# Patient Record
Sex: Female | Born: 1950
Health system: Southern US, Community
[De-identification: ages and names within clinical notes are randomized; demographics above are authoritative.]

## PROBLEM LIST (undated history)

## (undated) DIAGNOSIS — E119 Type 2 diabetes mellitus without complications: Secondary | ICD-10-CM

## (undated) DIAGNOSIS — R030 Elevated blood-pressure reading, without diagnosis of hypertension: Secondary | ICD-10-CM

## (undated) DIAGNOSIS — E669 Obesity, unspecified: Secondary | ICD-10-CM

## (undated) DIAGNOSIS — R748 Abnormal levels of other serum enzymes: Secondary | ICD-10-CM

## (undated) HISTORY — PX: ABDOMINAL HYSTERECTOMY: SHX81

## (undated) HISTORY — PX: TUBAL LIGATION: SHX77

---

## 2004-04-08 ENCOUNTER — Ambulatory Visit: Payer: Self-pay | Admitting: General Surgery

## 2004-06-11 ENCOUNTER — Ambulatory Visit: Payer: Self-pay | Admitting: Family Medicine

## 2005-07-28 ENCOUNTER — Ambulatory Visit: Payer: Self-pay | Admitting: Family Medicine

## 2007-02-03 ENCOUNTER — Ambulatory Visit: Payer: Self-pay | Admitting: Family Medicine

## 2007-05-10 DIAGNOSIS — E119 Type 2 diabetes mellitus without complications: Secondary | ICD-10-CM | POA: Insufficient documentation

## 2007-05-13 DIAGNOSIS — R899 Unspecified abnormal finding in specimens from other organs, systems and tissues: Secondary | ICD-10-CM | POA: Insufficient documentation

## 2008-07-03 DIAGNOSIS — R519 Headache, unspecified: Secondary | ICD-10-CM | POA: Insufficient documentation

## 2009-01-16 ENCOUNTER — Ambulatory Visit: Payer: Self-pay | Admitting: Family Medicine

## 2010-02-19 ENCOUNTER — Ambulatory Visit: Payer: Self-pay | Admitting: Family Medicine

## 2011-04-15 ENCOUNTER — Ambulatory Visit: Payer: Self-pay | Admitting: Family Medicine

## 2012-11-24 ENCOUNTER — Ambulatory Visit: Payer: Self-pay | Admitting: Family Medicine

## 2014-01-20 LAB — BASIC METABOLIC PANEL
BUN: 9 mg/dL (ref 4–21)
Creatinine: 0.7 mg/dL (ref 0.5–1.1)
GLUCOSE: 154 mg/dL
Potassium: 4.1 mmol/L (ref 3.4–5.3)
Sodium: 140 mmol/L (ref 137–147)

## 2014-01-20 LAB — CBC AND DIFFERENTIAL
HCT: 44 % (ref 36–46)
Hemoglobin: 15.1 g/dL (ref 12.0–16.0)
Platelets: 237 10*3/uL (ref 150–399)
WBC: 7.1 10^3/mL

## 2014-01-20 LAB — HEPATIC FUNCTION PANEL
ALT: 46 U/L — AB (ref 7–35)
AST: 63 U/L — AB (ref 13–35)

## 2014-01-20 LAB — LIPID PANEL
Cholesterol: 222 mg/dL — AB (ref 0–200)
HDL: 58 mg/dL (ref 35–70)
LDL Cholesterol: 127 mg/dL
Triglycerides: 185 mg/dL — AB (ref 40–160)

## 2014-01-20 LAB — TSH: TSH: 1.21 u[IU]/mL (ref 0.41–5.90)

## 2014-01-20 LAB — HEMOGLOBIN A1C: HEMOGLOBIN A1C: 7.5 % — AB (ref 4.0–6.0)

## 2014-03-08 ENCOUNTER — Ambulatory Visit: Payer: Self-pay | Admitting: Family Medicine

## 2014-03-08 LAB — HM MAMMOGRAPHY

## 2014-03-27 ENCOUNTER — Ambulatory Visit: Payer: Self-pay | Admitting: Gastroenterology

## 2014-03-27 LAB — HM COLONOSCOPY: HM Colonoscopy: 4

## 2014-07-14 ENCOUNTER — Other Ambulatory Visit: Payer: Self-pay | Admitting: Internal Medicine

## 2014-07-14 DIAGNOSIS — Z1231 Encounter for screening mammogram for malignant neoplasm of breast: Secondary | ICD-10-CM

## 2014-08-30 ENCOUNTER — Encounter (INDEPENDENT_AMBULATORY_CARE_PROVIDER_SITE_OTHER): Payer: Self-pay | Admitting: Ophthalmology

## 2014-09-29 HISTORY — PX: EYE SURGERY: SHX253

## 2015-02-13 DIAGNOSIS — E7800 Pure hypercholesterolemia, unspecified: Secondary | ICD-10-CM | POA: Insufficient documentation

## 2015-02-13 DIAGNOSIS — E559 Vitamin D deficiency, unspecified: Secondary | ICD-10-CM | POA: Insufficient documentation

## 2015-02-13 DIAGNOSIS — R748 Abnormal levels of other serum enzymes: Secondary | ICD-10-CM | POA: Insufficient documentation

## 2015-02-13 DIAGNOSIS — E669 Obesity, unspecified: Secondary | ICD-10-CM | POA: Insufficient documentation

## 2015-02-13 DIAGNOSIS — E663 Overweight: Secondary | ICD-10-CM | POA: Insufficient documentation

## 2015-02-13 DIAGNOSIS — R030 Elevated blood-pressure reading, without diagnosis of hypertension: Secondary | ICD-10-CM | POA: Insufficient documentation

## 2015-02-16 ENCOUNTER — Encounter: Payer: Self-pay | Admitting: Family Medicine

## 2015-02-16 ENCOUNTER — Ambulatory Visit (INDEPENDENT_AMBULATORY_CARE_PROVIDER_SITE_OTHER): Payer: BLUE CROSS/BLUE SHIELD | Admitting: Family Medicine

## 2015-02-16 VITALS — BP 102/64 | HR 64 | Temp 98.7°F | Resp 16 | Ht 61.5 in | Wt 127.0 lb

## 2015-02-16 DIAGNOSIS — E7800 Pure hypercholesterolemia, unspecified: Secondary | ICD-10-CM

## 2015-02-16 DIAGNOSIS — Z0000 Encounter for general adult medical examination without abnormal findings: Secondary | ICD-10-CM | POA: Diagnosis not present

## 2015-02-16 DIAGNOSIS — R748 Abnormal levels of other serum enzymes: Secondary | ICD-10-CM | POA: Diagnosis not present

## 2015-02-16 DIAGNOSIS — E119 Type 2 diabetes mellitus without complications: Secondary | ICD-10-CM | POA: Diagnosis not present

## 2015-02-16 DIAGNOSIS — E559 Vitamin D deficiency, unspecified: Secondary | ICD-10-CM | POA: Diagnosis not present

## 2015-02-16 LAB — POCT URINALYSIS DIPSTICK
Bilirubin, UA: NEGATIVE
Glucose, UA: NEGATIVE
Ketones, UA: NEGATIVE
Leukocytes, UA: NEGATIVE
NITRITE UA: NEGATIVE
PH UA: 8
Protein, UA: NEGATIVE
RBC UA: NEGATIVE
Spec Grav, UA: 1.01
UROBILINOGEN UA: 0.2

## 2015-02-16 NOTE — Progress Notes (Signed)
Patient ID: Taylor Goodwin, female   DOB: 05-20-1950, 64 y.o.   MRN: [id number]        Patient: Taylor Goodwin, Female    DOB: 1950-12-31, 64 y.o.   MRN: [id number] Visit Date: 02/16/2015  Today's Provider: Margarita Rana, MD   Chief Complaint  Patient presents with  . Annual Exam   Subjective:    Annual physical exam Taylor Goodwin is a 64 y.o. female who presents today for health maintenance and complete physical. She feels well. She reports exercising 2 times a week. She reports she is sleeping well.  Has lost a lot of weight with lifestyle changes. Hoping labs will look better.   01/20/14 CPE 03/08/14 Mammo-BI-RADS 1 03/27/14 Colon-polyps, recheck 5 yrs  Results for orders placed or performed in visit on 02/16/15  POCT urinalysis dipstick  Result Value Ref Range   Color, UA yellow    Clarity, UA clear    Glucose, UA neg    Bilirubin, UA neg    Ketones, UA neg    Spec Grav, UA 1.010    Blood, UA neg    pH, UA 8.0    Protein, UA neg    Urobilinogen, UA 0.2    Nitrite, UA neg    Leukocytes, UA Negative Negative    Lab Results  Component Value Date   WBC 7.1 01/20/2014   HGB 15.1 01/20/2014   HCT 44 01/20/2014   PLT 237 01/20/2014   CHOL 222* 01/20/2014   TRIG 185* 01/20/2014   HDL 58 01/20/2014   LDLCALC 127 01/20/2014   ALT 46* 01/20/2014   AST 63* 01/20/2014   NA 140 01/20/2014   K 4.1 01/20/2014   CREATININE 0.7 01/20/2014   BUN 9 01/20/2014   TSH 1.21 01/20/2014   HGBA1C 7.5* 01/20/2014     -----------------------------------------------------------------   Review of Systems  Constitutional: Negative.   HENT: Negative.   Eyes: Negative.   Respiratory: Negative.   Cardiovascular: Negative.   Gastrointestinal: Negative.   Endocrine: Negative.   Genitourinary: Negative.   Musculoskeletal: Negative.   Skin: Negative.   Allergic/Immunologic: Negative.   Neurological: Negative.   Hematological: Negative.   Psychiatric/Behavioral: Negative.      Social History She  reports that she has never smoked. She has never used smokeless tobacco. She reports that she does not drink alcohol or use illicit drugs. Social History   Social History  . Marital Status: Married    Spouse Name: N/A  . Number of Children: N/A  . Years of Education: N/A   Social History Main Topics  . Smoking status: Never Smoker   . Smokeless tobacco: Never Used  . Alcohol Use: No  . Drug Use: No  . Sexual Activity: Not Asked   Other Topics Concern  . None   Social History Narrative    Patient Active Problem List   Diagnosis Date Noted  . Adult BMI 30+ 02/13/2015  . Abnormal liver enzymes 02/13/2015  . Blood pressure elevated without history of HTN 02/13/2015  . Hypercholesteremia 02/13/2015  . Adiposity 02/13/2015  . Avitaminosis D 02/13/2015  . Cephalalgia 07/03/2008  . Abnormal laboratory test 05/13/2007  . Diabetes mellitus, type 2 ([place]) 05/10/2007    Past Surgical History  Procedure Laterality Date  . Abdominal hysterectomy    . Tubal ligation    . Cesarean section      x 2  . Eye surgery Bilateral 09/2014    cataract [facility]    Family History  Family Status  Relation Status Death Age  . Mother Deceased   . Father Deceased   . Brother Alive   . Brother Alive    Her family history includes Bipolar disorder in her father; Breast cancer in her mother; Diabetes in her other; Hypertension in her brother, brother, and father; Schizophrenia in her father.    No Known Allergies  Previous Medications   CALCIUM CARBONATE-VITAMIN D 600-200 MG-UNIT CAPS       LORATADINE (CLARITIN) 10 MG TABLET    Take by mouth.   MULTIPLE VITAMIN PO        Patient Care Team: Margarita Rana, MD as PCP - General (Family Medicine)     Objective:   Vitals: BP 102/64 mmHg  Pulse 64  Temp(Src) 98.7 F (37.1 C) (Oral)  Resp 16  Ht 5' 1.5" (1.562 m)  Wt 127 lb (57.607 kg)  BMI 23.61 kg/m2  SpO2 100%   Physical Exam  Constitutional: She is  oriented to person, place, and time. She appears well-developed and well-nourished.  HENT:  Head: Normocephalic and atraumatic.  Right Ear: Tympanic membrane, external ear and ear canal normal.  Left Ear: Tympanic membrane, external ear and ear canal normal.  Nose: Nose normal.  Mouth/Throat: Uvula is midline, oropharynx is clear and moist and mucous membranes are normal.  Eyes: Conjunctivae, EOM and lids are normal. Pupils are equal, round, and reactive to light.  Neck: Trachea normal and normal range of motion. Neck supple. Carotid bruit is not present. No thyroid mass and no thyromegaly present.  Cardiovascular: Normal rate, regular rhythm and normal heart sounds.   Pulmonary/Chest: Effort normal and breath sounds normal.  Abdominal: Soft. Normal appearance and bowel sounds are normal. There is no hepatosplenomegaly. There is no tenderness.  Genitourinary: No breast swelling, tenderness or discharge.  Musculoskeletal: Normal range of motion.  Lymphadenopathy:    She has no cervical adenopathy.    She has no axillary adenopathy.  Neurological: She is alert and oriented to person, place, and time. She has normal strength. No cranial nerve deficit.  Skin: Skin is warm, dry and intact.  Psychiatric: She has a normal mood and affect. Her speech is normal and behavior is normal. Judgment and thought content normal. Cognition and memory are normal.     Depression Screen PHQ 2/9 Scores 02/16/2015  PHQ - 2 Score 0      Assessment & Plan:     Routine Health Maintenance and Physical Exam  Exercise Activities and Dietary recommendations Goals    . Exercise 150 minutes per week (moderate activity)           1. Annual physical exam Stable. Patient advised to continue eating healthy and exercise daily. - POCT urinalysis dipstick  2. Diabetes mellitus without complication ([place]) - Hemoglobin A1c  3. Abnormal liver enzymes - CBC with Differential/Platelet - Comprehensive  metabolic panel  4. Hypercholesteremia - Lipid Panel With LDL/HDL Ratio - TSH  5. Avitaminosis D - VITAMIN D 25 Hydroxy (Vit-D Deficiency, Fractures)      Patient seen and examined by Dr. Jerrell Belfast, and note scribed by Philbert Riser. Dimas, CMA.  I have reviewed the document for accuracy and completeness and I agree with above. Jerrell Belfast, MD   Margarita Rana, MD    --------------------------------------------------------------------

## 2015-02-17 LAB — CBC WITH DIFFERENTIAL/PLATELET
BASOS ABS: 0 10*3/uL (ref 0.0–0.2)
Basos: 0 %
EOS (ABSOLUTE): 0.1 10*3/uL (ref 0.0–0.4)
Eos: 1 %
HEMATOCRIT: 42.7 % (ref 34.0–46.6)
Hemoglobin: 14.7 g/dL (ref 11.1–15.9)
Immature Grans (Abs): 0 10*3/uL (ref 0.0–0.1)
Immature Granulocytes: 0 %
LYMPHS ABS: 2.5 10*3/uL (ref 0.7–3.1)
Lymphs: 36 %
MCH: 31.3 pg (ref 26.6–33.0)
MCHC: 34.4 g/dL (ref 31.5–35.7)
MCV: 91 fL (ref 79–97)
MONOS ABS: 0.3 10*3/uL (ref 0.1–0.9)
Monocytes: 5 %
Neutrophils Absolute: 3.9 10*3/uL (ref 1.4–7.0)
Neutrophils: 58 %
Platelets: 239 10*3/uL (ref 150–379)
RBC: 4.69 x10E6/uL (ref 3.77–5.28)
RDW: 12.9 % (ref 12.3–15.4)
WBC: 6.9 10*3/uL (ref 3.4–10.8)

## 2015-02-17 LAB — COMPREHENSIVE METABOLIC PANEL
ALK PHOS: 71 IU/L (ref 39–117)
ALT: 14 IU/L (ref 0–32)
AST: 28 IU/L (ref 0–40)
Albumin/Globulin Ratio: 1.9 (ref 1.1–2.5)
Albumin: 5 g/dL — ABNORMAL HIGH (ref 3.6–4.8)
BUN/Creatinine Ratio: 12 (ref 11–26)
BUN: 9 mg/dL (ref 8–27)
Bilirubin Total: 0.5 mg/dL (ref 0.0–1.2)
CO2: 25 mmol/L (ref 18–29)
CREATININE: 0.73 mg/dL (ref 0.57–1.00)
Calcium: 10.4 mg/dL — ABNORMAL HIGH (ref 8.7–10.3)
Chloride: 102 mmol/L (ref 97–106)
GFR calc Af Amer: 102 mL/min/{1.73_m2} (ref >59)
GFR calc non Af Amer: 89 mL/min/{1.73_m2} (ref >59)
GLOBULIN, TOTAL: 2.6 g/dL (ref 1.5–4.5)
Glucose: 93 mg/dL (ref 65–99)
POTASSIUM: 4.8 mmol/L (ref 3.5–5.2)
Sodium: 143 mmol/L (ref 136–144)
Total Protein: 7.6 g/dL (ref 6.0–8.5)

## 2015-02-17 LAB — HEMOGLOBIN A1C
ESTIMATED AVERAGE GLUCOSE: 105 mg/dL
HEMOGLOBIN A1C: 5.3 % (ref 4.8–5.6)

## 2015-02-17 LAB — LIPID PANEL WITH LDL/HDL RATIO
Cholesterol, Total: 207 mg/dL — ABNORMAL HIGH (ref 100–199)
HDL: 68 mg/dL (ref >39)
LDL Calculated: 118 mg/dL — ABNORMAL HIGH (ref 0–99)
LDl/HDL Ratio: 1.7 ratio units (ref 0.0–3.2)
Triglycerides: 105 mg/dL (ref 0–149)
VLDL Cholesterol Cal: 21 mg/dL (ref 5–40)

## 2015-02-17 LAB — VITAMIN D 25 HYDROXY (VIT D DEFICIENCY, FRACTURES): VIT D 25 HYDROXY: 35.9 ng/mL (ref 30.0–100.0)

## 2015-02-17 LAB — TSH: TSH: 1.32 u[IU]/mL (ref 0.450–4.500)

## 2015-02-19 ENCOUNTER — Telehealth: Payer: Self-pay

## 2015-02-19 NOTE — Telephone Encounter (Signed)
Patient advised as directed below. Patient will call a day before for the labs.  Thanks,  -Akira Perusse

## 2015-02-19 NOTE — Telephone Encounter (Signed)
-----   Message from Margarita Rana, MD sent at 02/17/2015  8:58 AM EST ----- Labs look great. Blood sugar and liver enzymes now normal.  Triglycerides also now normal. Calcium and albumin mildly elevated.  May be because dehydrated when had labs done. Just recheck in 2 weeks.  Thanks.

## 2015-03-12 ENCOUNTER — Ambulatory Visit
Admission: RE | Admit: 2015-03-12 | Discharge: 2015-03-12 | Disposition: A | Discharge status: Home / Self Care | Payer: BLUE CROSS/BLUE SHIELD | Source: Ambulatory Visit | Attending: Internal Medicine | Admitting: Internal Medicine

## 2015-03-12 DIAGNOSIS — Z1231 Encounter for screening mammogram for malignant neoplasm of breast: Secondary | ICD-10-CM | POA: Diagnosis not present

## 2015-04-16 ENCOUNTER — Encounter: Payer: Self-pay | Admitting: Family Medicine

## 2016-02-18 ENCOUNTER — Encounter: Payer: Self-pay | Admitting: Physician Assistant

## 2016-02-18 ENCOUNTER — Ambulatory Visit (INDEPENDENT_AMBULATORY_CARE_PROVIDER_SITE_OTHER): Payer: BLUE CROSS/BLUE SHIELD | Admitting: Physician Assistant

## 2016-02-18 VITALS — BP 142/80 | HR 61 | Temp 98.2°F | Resp 16 | Ht 61.0 in | Wt 142.0 lb

## 2016-02-18 DIAGNOSIS — Z1159 Encounter for screening for other viral diseases: Secondary | ICD-10-CM | POA: Diagnosis not present

## 2016-02-18 DIAGNOSIS — Z0000 Encounter for general adult medical examination without abnormal findings: Secondary | ICD-10-CM

## 2016-02-18 DIAGNOSIS — E118 Type 2 diabetes mellitus with unspecified complications: Secondary | ICD-10-CM | POA: Diagnosis not present

## 2016-02-18 DIAGNOSIS — R030 Elevated blood-pressure reading, without diagnosis of hypertension: Secondary | ICD-10-CM | POA: Diagnosis not present

## 2016-02-18 DIAGNOSIS — Z1231 Encounter for screening mammogram for malignant neoplasm of breast: Secondary | ICD-10-CM

## 2016-02-18 DIAGNOSIS — E559 Vitamin D deficiency, unspecified: Secondary | ICD-10-CM

## 2016-02-18 DIAGNOSIS — E7800 Pure hypercholesterolemia, unspecified: Secondary | ICD-10-CM | POA: Diagnosis not present

## 2016-02-18 DIAGNOSIS — R748 Abnormal levels of other serum enzymes: Secondary | ICD-10-CM

## 2016-02-18 DIAGNOSIS — Z1239 Encounter for other screening for malignant neoplasm of breast: Secondary | ICD-10-CM

## 2016-02-18 NOTE — Progress Notes (Signed)
Patient: Taylor Goodwin, Female    DOB: 07/10/1950, 65 y.o.   MRN: [id number] Visit Date: 02/18/2016  Today's Provider: Mar Daring, PA-C   Chief Complaint  Patient presents with  . Annual Exam   Subjective:    Annual physical exam Taylor Goodwin is a 65 y.o. female who presents today for health maintenance and complete physical. She feels well. She reports exercising. She reports she is sleeping well. She reports that she got her influenza vaccine at work.  Mammogram:03/12/15-BI-RADS 1 Colonoscopy:03/27/14-Polyps Recheck in 5 years. -----------------------------------------------------------------   Review of Systems  Constitutional: Negative.   HENT: Negative.   Eyes: Negative.   Respiratory: Negative.   Cardiovascular: Negative.   Gastrointestinal: Negative.   Endocrine: Negative.   Genitourinary: Negative.   Musculoskeletal: Negative.   Skin: Negative.   Allergic/Immunologic: Negative.   Neurological: Negative.   Hematological: Negative.   Psychiatric/Behavioral: Negative.     Social History      She  reports that she has never smoked. She has never used smokeless tobacco. She reports that she does not drink alcohol or use drugs.       Social History   Social History  . Marital status: Married    Spouse name: N/A  . Number of children: N/A  . Years of education: N/A   Social History Main Topics  . Smoking status: Never Smoker  . Smokeless tobacco: Never Used  . Alcohol use No  . Drug use: No  . Sexual activity: Not Asked   Other Topics Concern  . None   Social History Narrative  . None    History reviewed. No pertinent past medical history.   Patient Active Problem List   Diagnosis Date Noted  . Adult BMI 30+ 02/13/2015  . Abnormal liver enzymes 02/13/2015  . Blood pressure elevated without history of HTN 02/13/2015  . Hypercholesteremia 02/13/2015  . Adiposity 02/13/2015  . Avitaminosis D 02/13/2015  . Cephalalgia  07/03/2008  . Abnormal laboratory test 05/13/2007  . Diabetes mellitus, type 2 ([place]) 05/10/2007    Past Surgical History:  Procedure Laterality Date  . ABDOMINAL HYSTERECTOMY    . CESAREAN SECTION     x 2  . EYE SURGERY Bilateral 09/2014   cataract [facility]  . TUBAL LIGATION      Family History        Family Status  Relation Status  . Mother Deceased  . Father Deceased  . Brother Alive  . Brother Alive  . Other         Her family history includes Bipolar disorder in her father; Breast cancer (age of onset: 59) in her mother; Diabetes in her other; Hypertension in her brother, brother, and father; Schizophrenia in her father.     No Known Allergies   Current Outpatient Prescriptions:  .  Calcium Carbonate-Vitamin D 600-200 MG-UNIT CAPS, , Disp: , Rfl:  .  loratadine (CLARITIN) 10 MG tablet, Take by mouth., Disp: , Rfl:  .  MULTIPLE VITAMIN PO, , Disp: , Rfl:    Patient Care Team: Mar Daring, PA-C as PCP - General (Family Medicine)      Objective:   Vitals: BP (!) 142/80 (BP Location: Right Arm, Patient Position: Sitting, Cuff Size: Normal)   Pulse 61   Temp 98.2 F (36.8 C) (Oral)   Resp 16   Ht 5\' 1"  (1.549 m)   Wt 142 lb (64.4 kg)   BMI 26.83 kg/m    Physical Exam  Constitutional: She is oriented to person, place, and time. She appears well-developed and well-nourished. No distress.  HENT:  Head: Normocephalic and atraumatic.  Right Ear: Hearing, tympanic membrane, external ear and ear canal normal.  Left Ear: Hearing, tympanic membrane, external ear and ear canal normal.  Nose: Nose normal.  Mouth/Throat: Uvula is midline, oropharynx is clear and moist and mucous membranes are normal. No oropharyngeal exudate.  Eyes: Conjunctivae and EOM are normal. Pupils are equal, round, and reactive to light. Right eye exhibits no discharge. Left eye exhibits no discharge. No scleral icterus.  Neck: Normal range of motion. Neck supple. No JVD present. Carotid  bruit is not present. No tracheal deviation present. No thyromegaly present.  Cardiovascular: Normal rate, regular rhythm, normal heart sounds and intact distal pulses.  Exam reveals no gallop and no friction rub.   No murmur heard. Pulmonary/Chest: Effort normal and breath sounds normal. No respiratory distress. She has no wheezes. She has no rales. She exhibits no tenderness. Right breast exhibits no inverted nipple, no mass, no nipple discharge, no skin change and no tenderness. Left breast exhibits no inverted nipple, no mass, no nipple discharge, no skin change and no tenderness. Breasts are symmetrical.  Abdominal: Soft. Bowel sounds are normal. She exhibits no distension and no mass. There is no tenderness. There is no rebound and no guarding.  Musculoskeletal: Normal range of motion. She exhibits no edema or tenderness.  Lymphadenopathy:    She has no cervical adenopathy.  Neurological: She is alert and oriented to person, place, and time.  Skin: Skin is warm and dry. No rash noted. She is not diaphoretic.  Psychiatric: She has a normal mood and affect. Her behavior is normal. Judgment and thought content normal.  Vitals reviewed.    Depression Screen PHQ 2/9 Scores 02/16/2015  PHQ - 2 Score 0      Assessment & Plan:     Routine Health Maintenance and Physical Exam  Exercise Activities and Dietary recommendations Goals    . Exercise 150 minutes per week (moderate activity)        There is no immunization history on file for this patient.  Health Maintenance  Topic Date Due  . Hepatitis C Screening  1950/11/08  . PNEUMOCOCCAL POLYSACCHARIDE VACCINE (1) 02/14/1955  . FOOT EXAM  02/14/1963  . OPHTHALMOLOGY EXAM  02/14/1963  . URINE MICROALBUMIN  02/14/1963  . HIV Screening  02/14/1968  . PAP SMEAR  02/13/1974  . ZOSTAVAX  02/13/2013  . TETANUS/TDAP  04/11/2015  . HEMOGLOBIN A1C  08/16/2015  . INFLUENZA VACCINE  10/30/2015  . MAMMOGRAM  03/11/2017  . COLONOSCOPY   03/27/2024     Discussed health benefits of physical activity, and encouraged her to engage in regular exercise appropriate for her age and condition.    1. Annual physical exam Normal physical exam today. Will check labs as below and f/u pending lab results. If labs are stable and WNL she will not need to have these rechecked for one year at her next annual physical exam. She is to call the office in the meantime if she has any acute issue, questions or concerns. - CBC with Differential/Platelet - TSH  2. Type 2 diabetes mellitus with complication, unspecified long term insulin use status ([facility]) Stable. Diet controlled. Will check labs as below and f/u pending results. - CBC with Differential/Platelet - Hemoglobin A1c  3. Blood pressure elevated without history of HTN Elevated today in the office. At home readings she reports are normal.  Will check labs as below and f/u pending results. - CBC with Differential/Platelet - Comprehensive metabolic panel - Lipid panel - TSH  4. Abnormal liver enzymes Will check labs as below and f/u pending results. - Comprehensive metabolic panel - Lipid panel  5. Avitaminosis D Will check labs as below and f/u pending results. - CBC with Differential/Platelet - Comprehensive metabolic panel  6. Hypercholesteremia Previously stable. Will check labs as below and f/u pending results. - Comprehensive metabolic panel - Lipid panel - TSH  7. Breast cancer screening Breast exam today was normal. There is family history of breast cancer in her mother. She does perform regular self breast exams. Mammogram was ordered as below. Information for [facility] was given to patient so she may schedule her mammogram at her convenience. - MM DIGITAL SCREENING BILATERAL; Future  8. Need for hepatitis C screening test - Hepatitis C antibody  --------------------------------------------------------------------    Mar Daring, PA-C    [facility]

## 2016-02-18 NOTE — Patient Instructions (Signed)

## 2016-02-19 ENCOUNTER — Telehealth: Payer: Self-pay

## 2016-02-19 LAB — CBC WITH DIFFERENTIAL/PLATELET
BASOS: 0 %
Basophils Absolute: 0 10*3/uL (ref 0.0–0.2)
EOS (ABSOLUTE): 0.1 10*3/uL (ref 0.0–0.4)
EOS: 1 %
HEMATOCRIT: 40.5 % (ref 34.0–46.6)
HEMOGLOBIN: 14.2 g/dL (ref 11.1–15.9)
Immature Grans (Abs): 0 10*3/uL (ref 0.0–0.1)
Immature Granulocytes: 0 %
LYMPHS ABS: 2.4 10*3/uL (ref 0.7–3.1)
Lymphs: 37 %
MCH: 31.9 pg (ref 26.6–33.0)
MCHC: 35.1 g/dL (ref 31.5–35.7)
MCV: 91 fL (ref 79–97)
MONOCYTES: 5 %
MONOS ABS: 0.3 10*3/uL (ref 0.1–0.9)
NEUTROS ABS: 3.7 10*3/uL (ref 1.4–7.0)
Neutrophils: 57 %
Platelets: 223 10*3/uL (ref 150–379)
RBC: 4.45 x10E6/uL (ref 3.77–5.28)
RDW: 12.6 % (ref 12.3–15.4)
WBC: 6.5 10*3/uL (ref 3.4–10.8)

## 2016-02-19 LAB — HEMOGLOBIN A1C
Est. average glucose Bld gHb Est-mCnc: 103 mg/dL
Hgb A1c MFr Bld: 5.2 % (ref 4.8–5.6)

## 2016-02-19 LAB — TSH: TSH: 0.89 u[IU]/mL (ref 0.450–4.500)

## 2016-02-19 LAB — COMPREHENSIVE METABOLIC PANEL
A/G RATIO: 1.9 (ref 1.2–2.2)
ALBUMIN: 4.7 g/dL (ref 3.6–4.8)
ALK PHOS: 75 IU/L (ref 39–117)
ALT: 10 IU/L (ref 0–32)
AST: 21 IU/L (ref 0–40)
BUN / CREAT RATIO: 16 (ref 12–28)
BUN: 11 mg/dL (ref 8–27)
Bilirubin Total: 0.4 mg/dL (ref 0.0–1.2)
CO2: 26 mmol/L (ref 18–29)
CREATININE: 0.67 mg/dL (ref 0.57–1.00)
Calcium: 10.1 mg/dL (ref 8.7–10.3)
Chloride: 104 mmol/L (ref 96–106)
GFR calc Af Amer: 108 mL/min/{1.73_m2} (ref >59)
GFR calc non Af Amer: 94 mL/min/{1.73_m2} (ref >59)
GLOBULIN, TOTAL: 2.5 g/dL (ref 1.5–4.5)
Glucose: 93 mg/dL (ref 65–99)
POTASSIUM: 4.9 mmol/L (ref 3.5–5.2)
SODIUM: 145 mmol/L — AB (ref 134–144)
Total Protein: 7.2 g/dL (ref 6.0–8.5)

## 2016-02-19 LAB — LIPID PANEL
CHOL/HDL RATIO: 3.1 ratio (ref 0.0–4.4)
Cholesterol, Total: 213 mg/dL — ABNORMAL HIGH (ref 100–199)
HDL: 68 mg/dL (ref >39)
LDL CALC: 109 mg/dL — AB (ref 0–99)
TRIGLYCERIDES: 178 mg/dL — AB (ref 0–149)
VLDL Cholesterol Cal: 36 mg/dL (ref 5–40)

## 2016-02-19 LAB — HEPATITIS C ANTIBODY

## 2016-02-19 NOTE — Telephone Encounter (Signed)
-----   Message from Mar Daring, [place] sent at 02/19/2016 12:11 PM EST ----- All labs are within normal limits and stable.  Thanks! -JB

## 2016-02-19 NOTE — Telephone Encounter (Signed)
Patient has been advised. KW 

## 2016-02-25 DIAGNOSIS — H18603 Keratoconus, unspecified, bilateral: Secondary | ICD-10-CM | POA: Diagnosis not present

## 2016-04-08 ENCOUNTER — Ambulatory Visit
Admission: RE | Admit: 2016-04-08 | Discharge: 2016-04-08 | Disposition: A | Discharge status: Home / Self Care | Payer: BLUE CROSS/BLUE SHIELD | Source: Ambulatory Visit | Attending: Physician Assistant | Admitting: Physician Assistant

## 2016-04-08 DIAGNOSIS — Z1239 Encounter for other screening for malignant neoplasm of breast: Secondary | ICD-10-CM

## 2016-04-08 DIAGNOSIS — Z1231 Encounter for screening mammogram for malignant neoplasm of breast: Secondary | ICD-10-CM | POA: Diagnosis not present

## 2016-06-23 DIAGNOSIS — H18613 Keratoconus, stable, bilateral: Secondary | ICD-10-CM | POA: Diagnosis not present

## 2017-02-16 DIAGNOSIS — H26499 Other secondary cataract, unspecified eye: Secondary | ICD-10-CM | POA: Diagnosis not present

## 2017-03-03 ENCOUNTER — Other Ambulatory Visit: Payer: Self-pay

## 2017-03-03 ENCOUNTER — Ambulatory Visit (INDEPENDENT_AMBULATORY_CARE_PROVIDER_SITE_OTHER): Payer: BLUE CROSS/BLUE SHIELD | Admitting: Physician Assistant

## 2017-03-03 ENCOUNTER — Encounter: Payer: Self-pay | Admitting: Physician Assistant

## 2017-03-03 VITALS — BP 128/70 | HR 63 | Temp 97.9°F | Resp 16 | Ht 61.0 in | Wt 151.8 lb

## 2017-03-03 DIAGNOSIS — Z1231 Encounter for screening mammogram for malignant neoplasm of breast: Secondary | ICD-10-CM | POA: Diagnosis not present

## 2017-03-03 DIAGNOSIS — E119 Type 2 diabetes mellitus without complications: Secondary | ICD-10-CM | POA: Diagnosis not present

## 2017-03-03 DIAGNOSIS — R748 Abnormal levels of other serum enzymes: Secondary | ICD-10-CM | POA: Diagnosis not present

## 2017-03-03 DIAGNOSIS — Z6828 Body mass index (BMI) 28.0-28.9, adult: Secondary | ICD-10-CM

## 2017-03-03 DIAGNOSIS — Z803 Family history of malignant neoplasm of breast: Secondary | ICD-10-CM

## 2017-03-03 DIAGNOSIS — E7800 Pure hypercholesterolemia, unspecified: Secondary | ICD-10-CM

## 2017-03-03 DIAGNOSIS — Z114 Encounter for screening for human immunodeficiency virus [HIV]: Secondary | ICD-10-CM | POA: Diagnosis not present

## 2017-03-03 DIAGNOSIS — Z0000 Encounter for general adult medical examination without abnormal findings: Secondary | ICD-10-CM | POA: Diagnosis not present

## 2017-03-03 DIAGNOSIS — R030 Elevated blood-pressure reading, without diagnosis of hypertension: Secondary | ICD-10-CM

## 2017-03-03 DIAGNOSIS — E559 Vitamin D deficiency, unspecified: Secondary | ICD-10-CM

## 2017-03-03 DIAGNOSIS — Z780 Asymptomatic menopausal state: Secondary | ICD-10-CM

## 2017-03-03 DIAGNOSIS — Z1239 Encounter for other screening for malignant neoplasm of breast: Secondary | ICD-10-CM

## 2017-03-03 LAB — POCT UA - MICROALBUMIN: Microalbumin Ur, POC: 20 mg/L

## 2017-03-03 NOTE — Patient Instructions (Signed)
Health Maintenance for Postmenopausal Women Menopause is a normal process in which your reproductive ability comes to an end. This process happens gradually over a span of months to years, usually between the ages of 22 and 9. Menopause is complete when you have missed 12 consecutive menstrual periods. It is important to talk with your health care provider about some of the most common conditions that affect postmenopausal women, such as heart disease, cancer, and bone loss (osteoporosis). Adopting a healthy lifestyle and getting preventive care can help to promote your health and wellness. Those actions can also lower your chances of developing some of these common conditions. What should I know about menopause? During menopause, you may experience a number of symptoms, such as:  Moderate-to-severe hot flashes.  Night sweats.  Decrease in sex drive.  Mood swings.  Headaches.  Tiredness.  Irritability.  Memory problems.  Insomnia.  Choosing to treat or not to treat menopausal changes is an individual decision that you make with your health care provider. What should I know about hormone replacement therapy and supplements? Hormone therapy products are effective for treating symptoms that are associated with menopause, such as hot flashes and night sweats. Hormone replacement carries certain risks, especially as you become older. If you are thinking about using estrogen or estrogen with progestin treatments, discuss the benefits and risks with your health care provider. What should I know about heart disease and stroke? Heart disease, heart attack, and stroke become more likely as you age. This may be due, in part, to the hormonal changes that your body experiences during menopause. These can affect how your body processes dietary fats, triglycerides, and cholesterol. Heart attack and stroke are both medical emergencies. There are many things that you can do to help prevent heart disease  and stroke:  Have your blood pressure checked at least every 1-2 years. High blood pressure causes heart disease and increases the risk of stroke.  If you are 53-22 years old, ask your health care provider if you should take aspirin to prevent a heart attack or a stroke.  Do not use any tobacco products, including cigarettes, chewing tobacco, or electronic cigarettes. If you need help quitting, ask your health care provider.  It is important to eat a healthy diet and maintain a healthy weight. ? Be sure to include plenty of vegetables, fruits, low-fat dairy products, and lean protein. ? Avoid eating foods that are high in solid fats, added sugars, or salt (sodium).  Get regular exercise. This is one of the most important things that you can do for your health. ? Try to exercise for at least 150 minutes each week. The type of exercise that you do should increase your heart rate and make you sweat. This is known as moderate-intensity exercise. ? Try to do strengthening exercises at least twice each week. Do these in addition to the moderate-intensity exercise.  Know your numbers.Ask your health care provider to check your cholesterol and your blood glucose. Continue to have your blood tested as directed by your health care provider.  What should I know about cancer screening? There are several types of cancer. Take the following steps to reduce your risk and to catch any cancer development as early as possible. Breast Cancer  Practice breast self-awareness. ? This means understanding how your breasts normally appear and feel. ? It also means doing regular breast self-exams. Let your health care provider know about any changes, no matter how small.  If you are 40  or older, have a clinician do a breast exam (clinical breast exam or CBE) every year. Depending on your age, family history, and medical history, it may be recommended that you also have a yearly breast X-ray (mammogram).  If you  have a family history of breast cancer, talk with your health care provider about genetic screening.  If you are at high risk for breast cancer, talk with your health care provider about having an MRI and a mammogram every year.  Breast cancer (BRCA) gene test is recommended for women who have family members with BRCA-related cancers. Results of the assessment will determine the need for genetic counseling and BRCA1 and for BRCA2 testing. BRCA-related cancers include these types: ? Breast. This occurs in males or females. ? Ovarian. ? Tubal. This may also be called fallopian tube cancer. ? Cancer of the abdominal or pelvic lining (peritoneal cancer). ? Prostate. ? Pancreatic.  Cervical, Uterine, and Ovarian Cancer Your health care provider may recommend that you be screened regularly for cancer of the pelvic organs. These include your ovaries, uterus, and vagina. This screening involves a pelvic exam, which includes checking for microscopic changes to the surface of your cervix (Pap test).  For women ages 21-65, health care providers may recommend a pelvic exam and a Pap test every three years. For women ages 79-65, they may recommend the Pap test and pelvic exam, combined with testing for human papilloma virus (HPV), every five years. Some types of HPV increase your risk of cervical cancer. Testing for HPV may also be done on women of any age who have unclear Pap test results.  Other health care providers may not recommend any screening for nonpregnant women who are considered low risk for pelvic cancer and have no symptoms. Ask your health care provider if a screening pelvic exam is right for you.  If you have had past treatment for cervical cancer or a condition that could lead to cancer, you need Pap tests and screening for cancer for at least 20 years after your treatment. If Pap tests have been discontinued for you, your risk factors (such as having a new sexual partner) need to be  reassessed to determine if you should start having screenings again. Some women have medical problems that increase the chance of getting cervical cancer. In these cases, your health care provider may recommend that you have screening and Pap tests more often.  If you have a family history of uterine cancer or ovarian cancer, talk with your health care provider about genetic screening.  If you have vaginal bleeding after reaching menopause, tell your health care provider.  There are currently no reliable tests available to screen for ovarian cancer.  Lung Cancer Lung cancer screening is recommended for adults 69-62 years old who are at high risk for lung cancer because of a history of smoking. A yearly low-dose CT scan of the lungs is recommended if you:  Currently smoke.  Have a history of at least 30 pack-years of smoking and you currently smoke or have quit within the past 15 years. A pack-year is smoking an average of one pack of cigarettes per day for one year.  Yearly screening should:  Continue until it has been 15 years since you quit.  Stop if you develop a health problem that would prevent you from having lung cancer treatment.  Colorectal Cancer  This type of cancer can be detected and can often be prevented.  Routine colorectal cancer screening usually begins at  age 42 and continues through age 45.  If you have risk factors for colon cancer, your health care provider may recommend that you be screened at an earlier age.  If you have a family history of colorectal cancer, talk with your health care provider about genetic screening.  Your health care provider may also recommend using home test kits to check for hidden blood in your stool.  A small camera at the end of a tube can be used to examine your colon directly (sigmoidoscopy or colonoscopy). This is done to check for the earliest forms of colorectal cancer.  Direct examination of the colon should be repeated every  5-10 years until age 71. However, if early forms of precancerous polyps or small growths are found or if you have a family history or genetic risk for colorectal cancer, you may need to be screened more often.  Skin Cancer  Check your skin from head to toe regularly.  Monitor any moles. Be sure to tell your health care provider: ? About any new moles or changes in moles, especially if there is a change in a mole's shape or color. ? If you have a mole that is larger than the size of a pencil eraser.  If any of your family members has a history of skin cancer, especially at a young age, talk with your health care provider about genetic screening.  Always use sunscreen. Apply sunscreen liberally and repeatedly throughout the day.  Whenever you are outside, protect yourself by wearing long sleeves, pants, a wide-brimmed hat, and sunglasses.  What should I know about osteoporosis? Osteoporosis is a condition in which bone destruction happens more quickly than new bone creation. After menopause, you may be at an increased risk for osteoporosis. To help prevent osteoporosis or the bone fractures that can happen because of osteoporosis, the following is recommended:  If you are 46-71 years old, get at least 1,000 mg of calcium and at least 600 mg of vitamin D per day.  If you are older than age 55 but younger than age 65, get at least 1,200 mg of calcium and at least 600 mg of vitamin D per day.  If you are older than age 54, get at least 1,200 mg of calcium and at least 800 mg of vitamin D per day.  Smoking and excessive alcohol intake increase the risk of osteoporosis. Eat foods that are rich in calcium and vitamin D, and do weight-bearing exercises several times each week as directed by your health care provider. What should I know about how menopause affects my mental health? Depression may occur at any age, but it is more common as you become older. Common symptoms of depression  include:  Low or sad mood.  Changes in sleep patterns.  Changes in appetite or eating patterns.  Feeling an overall lack of motivation or enjoyment of activities that you previously enjoyed.  Frequent crying spells.  Talk with your health care provider if you think that you are experiencing depression. What should I know about immunizations? It is important that you get and maintain your immunizations. These include:  Tetanus, diphtheria, and pertussis (Tdap) booster vaccine.  Influenza every year before the flu season begins.  Pneumonia vaccine.  Shingles vaccine.  Your health care provider may also recommend other immunizations. This information is not intended to replace advice given to you by your health care provider. Make sure you discuss any questions you have with your health care provider. Document Released: 05/09/2005  Document Revised: 10/05/2015 Document Reviewed: 12/19/2014 Elsevier Interactive Patient Education  2018 Elsevier Inc.  

## 2017-03-03 NOTE — Progress Notes (Signed)
Patient: Taylor Goodwin, Female    DOB: 03/09/1951, 66 y.o.   MRN: [id number] Visit Date: 03/03/2017  Today's Provider: Mar Daring, PA-C   Chief Complaint  Patient presents with  . Annual Exam   Subjective:    Annual physical exam Taylor Goodwin is a 66 y.o. female who presents today for health maintenance and complete physical. She feels well. She reports exercising, patient reports that she is just starting back. She reports she is sleeping well.  Patient reports she received her Influenza vaccine 12/2016 at [place]. Tdap:05/01/2009 per patient she was working at [employer]. Mammogram: BiRads:1-1/10/18, positive family history in mother (age of onset 70) -----------------------------------------------------------------   Review of Systems  Constitutional: Negative.   HENT: Negative.   Eyes: Positive for visual disturbance (already seeing ophthalmology; surgery scheduled in Jan; scarring over lense from cataract surgery).  Respiratory: Negative.   Cardiovascular: Negative.   Gastrointestinal: Negative.   Endocrine: Negative.   Genitourinary: Negative.   Musculoskeletal: Negative.   Skin: Negative.   Allergic/Immunologic: Negative.   Neurological: Negative.   Hematological: Negative.   Psychiatric/Behavioral: Negative.     Social History      She  reports that  has never smoked. she has never used smokeless tobacco. She reports that she does not drink alcohol or use drugs.       Social History   Socioeconomic History  . Marital status: Married    Spouse name: None  . Number of children: None  . Years of education: None  . Highest education level: None  Social Needs  . Financial resource strain: None  . Food insecurity - worry: None  . Food insecurity - inability: None  . Transportation needs - medical: None  . Transportation needs - non-medical: None  Occupational History  . None  Tobacco Use  . Smoking status: Never Smoker  .  Smokeless tobacco: Never Used  Substance and Sexual Activity  . Alcohol use: No  . Drug use: No  . Sexual activity: None  Other Topics Concern  . None  Social History Narrative  . None    History reviewed. No pertinent past medical history.   Patient Active Problem List   Diagnosis Date Noted  . Adult BMI 30+ 02/13/2015  . Abnormal liver enzymes 02/13/2015  . Blood pressure elevated without history of HTN 02/13/2015  . Hypercholesteremia 02/13/2015  . Adiposity 02/13/2015  . Avitaminosis D 02/13/2015  . Cephalalgia 07/03/2008  . Abnormal laboratory test 05/13/2007  . Diabetes mellitus, type 2 ([place]) 05/10/2007    Past Surgical History:  Procedure Laterality Date  . ABDOMINAL HYSTERECTOMY    . CESAREAN SECTION     x 2  . EYE SURGERY Bilateral 09/2014   cataract [facility]  . TUBAL LIGATION      Family History        Family Status  Relation Name Status  . Mother  Deceased  . Father  Deceased  . Brother  Alive  . Brother  Alive  . Other  (Not Specified)        Her family history includes Bipolar disorder in her father; Breast cancer (age of onset: 45) in her mother; Diabetes in her other; Hypertension in her brother, brother, and father; Schizophrenia in her father.     No Known Allergies   Current Outpatient Medications:  .  loratadine (CLARITIN) 10 MG tablet, Take by mouth., Disp: , Rfl:  .  MULTIPLE VITAMIN PO, , Disp: , Rfl:  Patient Care Team: Mar Daring, PA-C as PCP - General (Family Medicine)      Objective:   Vitals: BP 128/70 (BP Location: Left Arm, Patient Position: Sitting, Cuff Size: Normal)   Pulse 63   Temp 97.9 F (36.6 C) (Oral)   Resp 16   Ht 5\' 1"  (1.549 m)   Wt 151 lb 12.8 oz (68.9 kg)   BMI 28.68 kg/m     Physical Exam  Constitutional: She is oriented to person, place, and time. She appears well-developed and well-nourished.  HENT:  Head: Normocephalic and atraumatic.  Right Ear: External ear normal.  Left Ear:  External ear normal.  Nose: Nose normal.  Mouth/Throat: Oropharynx is clear and moist.  Eyes: Conjunctivae and EOM are normal. Pupils are equal, round, and reactive to light.  Neck: Normal range of motion. Neck supple.  Cardiovascular: Normal rate, regular rhythm, normal heart sounds and intact distal pulses.  Pulmonary/Chest: Effort normal and breath sounds normal.  Abdominal: Soft. Bowel sounds are normal.  Musculoskeletal: Normal range of motion.  Neurological: She is alert and oriented to person, place, and time. She has normal reflexes.  Skin: Skin is warm and dry.  Psychiatric: She has a normal mood and affect. Her behavior is normal. Judgment and thought content normal.   Diabetic Foot Exam - Simple   Simple Foot Form Diabetic Foot exam was performed with the following findings:  Yes 03/03/2017  9:57 AM  Visual Inspection No deformities, no ulcerations, no other skin breakdown bilaterally:  Yes Sensation Testing Intact to touch and monofilament testing bilaterally:  Yes Pulse Check Posterior Tibialis and Dorsalis pulse intact bilaterally:  Yes Comments     Depression Screen PHQ 2/9 Scores 03/03/2017 02/16/2015  PHQ - 2 Score 0 0    Assessment & Plan:     Routine Health Maintenance and Physical Exam  Exercise Activities and Dietary recommendations Goals    . Exercise 150 minutes per week (moderate activity)       Immunization History  Administered Date(s) Administered  . Tdap 05/01/2009    Health Maintenance  Topic Date Due  . PNEUMOCOCCAL POLYSACCHARIDE VACCINE (1) 02/14/1955  . FOOT EXAM  02/14/1963  . OPHTHALMOLOGY EXAM  02/14/1963  . URINE MICROALBUMIN  02/14/1963  . HIV Screening  02/14/1968  . PAP SMEAR  02/13/1974  . HEMOGLOBIN A1C  08/17/2016  . INFLUENZA VACCINE  10/29/2016  . MAMMOGRAM  04/08/2018  . TETANUS/TDAP  05/02/2019  . COLONOSCOPY  03/27/2024  . Hepatitis C Screening  Completed     Discussed health benefits of physical activity,  and encouraged her to engage in regular exercise appropriate for her age and condition.    1. Annual physical exam Normal physical exam today. Will check labs as below and f/u pending lab results. If labs are stable and WNL she will not need to have these rechecked for one year at her next annual physical exam. She is to call the office in the meantime if she has any acute issue, questions or concerns. - CBC with Differential/Platelet - Comprehensive metabolic panel - TSH  2. Breast cancer screening Breast exam today was normal. There is family history of breast cancer in her mother. She does perform regular self breast exams. Mammogram was ordered as below. Information for [facility] clinic was given to patient so she may schedule her mammogram at her convenience. - MM DIGITAL SCREENING BILATERAL; Future  3. Family history of breast cancer in mother Age of onset at 43. -  MM DIGITAL SCREENING BILATERAL; Future  4. Type 2 diabetes mellitus without complication, unspecified whether long term insulin use ([place]) Has been diet controlled. Last A1c was 5.2. Will repeat A1c today. Microalbumin 20 today.  - CBC with Differential/Platelet - Comprehensive metabolic panel - Hemoglobin A1c - Lipid panel - POCT UA - Microalbumin  5. Hypercholesteremia Diet controlled. Will check labs as below and f/u pending results. - CBC with Differential/Platelet - Comprehensive metabolic panel - Hemoglobin A1c - Lipid panel  6. Blood pressure elevated without history of HTN BP normal today. Will check labs as below and f/u pending results. - CBC with Differential/Platelet - Comprehensive metabolic panel - Hemoglobin A1c - Lipid panel  7. BMI 28.0-28.9,adult Counseled patient on healthy lifestyle modifications including dieting and exercise.  - CBC with Differential/Platelet - Comprehensive metabolic panel - Hemoglobin A1c - Lipid panel  8. Abnormal liver enzymes H/O this. Had improved since  dietary and lifestyle changes.  - CBC with Differential/Platelet - Comprehensive metabolic panel  9. Avitaminosis D H/O this and post menopausal. Not on supplementation. Will check labs as below and f/u pending results. Will check BMD also for baseline. - CBC with Differential/Platelet - Comprehensive metabolic panel - DG Bone Density; Future - Vitamin D (25 hydroxy)  10. Postmenopausal estrogen deficiency See above medical treatment plan. - DG Bone Density; Future - Vitamin D (25 hydroxy)  11. Screening for HIV without presence of risk factors - HIV antibody  --------------------------------------------------------------------    Mar Daring, PA-C  [facility]

## 2017-03-04 ENCOUNTER — Telehealth: Payer: Self-pay

## 2017-03-04 LAB — COMPLETE METABOLIC PANEL WITH GFR
AG RATIO: 2 (calc) (ref 1.0–2.5)
ALKALINE PHOSPHATASE (APISO): 77 U/L (ref 33–130)
ALT: 12 U/L (ref 6–29)
AST: 21 U/L (ref 10–35)
Albumin: 4.9 g/dL (ref 3.6–5.1)
BILIRUBIN TOTAL: 0.6 mg/dL (ref 0.2–1.2)
BUN: 10 mg/dL (ref 7–25)
CHLORIDE: 104 mmol/L (ref 98–110)
CO2: 29 mmol/L (ref 20–32)
Calcium: 9.9 mg/dL (ref 8.6–10.4)
Creat: 0.79 mg/dL (ref 0.50–0.99)
GFR, Est African American: 92 mL/min/{1.73_m2} (ref >=60)
GFR, Est Non African American: 79 mL/min/{1.73_m2} (ref >=60)
GLUCOSE: 86 mg/dL (ref 65–99)
Globulin: 2.4 g/dL (calc) (ref 1.9–3.7)
POTASSIUM: 4.5 mmol/L (ref 3.5–5.3)
Sodium: 142 mmol/L (ref 135–146)
Total Protein: 7.3 g/dL (ref 6.1–8.1)

## 2017-03-04 LAB — CBC WITH DIFFERENTIAL/PLATELET
BASOS ABS: 28 {cells}/uL (ref 0–200)
Basophils Relative: 0.4 %
EOS PCT: 0.6 %
Eosinophils Absolute: 43 cells/uL (ref 15–500)
HEMATOCRIT: 41.2 % (ref 35.0–45.0)
Hemoglobin: 14.3 g/dL (ref 11.7–15.5)
LYMPHS ABS: 1754 {cells}/uL (ref 850–3900)
MCH: 30.9 pg (ref 27.0–33.0)
MCHC: 34.7 g/dL (ref 32.0–36.0)
MCV: 89 fL (ref 80.0–100.0)
MPV: 10.2 fL (ref 7.5–12.5)
Monocytes Relative: 5.5 %
NEUTROS PCT: 68.8 %
Neutro Abs: 4885 cells/uL (ref 1500–7800)
PLATELETS: 229 10*3/uL (ref 140–400)
RBC: 4.63 10*6/uL (ref 3.80–5.10)
RDW: 11.9 % (ref 11.0–15.0)
TOTAL LYMPHOCYTE: 24.7 %
WBC mixed population: 391 cells/uL (ref 200–950)
WBC: 7.1 10*3/uL (ref 3.8–10.8)

## 2017-03-04 LAB — HEMOGLOBIN A1C
EAG (MMOL/L): 6.3 (calc)
Hgb A1c MFr Bld: 5.6 % of total Hgb (ref <5.7)
Mean Plasma Glucose: 114 (calc)

## 2017-03-04 LAB — LIPID PANEL
Cholesterol: 207 mg/dL — ABNORMAL HIGH (ref <200)
HDL: 65 mg/dL (ref >50)
LDL Cholesterol (Calc): 120 mg/dL (calc) — ABNORMAL HIGH
NON-HDL CHOLESTEROL (CALC): 142 mg/dL — AB (ref <130)
Total CHOL/HDL Ratio: 3.2 (calc) (ref <5.0)
Triglycerides: 108 mg/dL (ref <150)

## 2017-03-04 LAB — HIV ANTIBODY (ROUTINE TESTING W REFLEX): HIV: NONREACTIVE

## 2017-03-04 LAB — VITAMIN D 25 HYDROXY (VIT D DEFICIENCY, FRACTURES): Vit D, 25-Hydroxy: 30 ng/mL (ref 30–100)

## 2017-03-04 LAB — TSH: TSH: 0.46 m[IU]/L (ref 0.40–4.50)

## 2017-03-04 NOTE — Telephone Encounter (Signed)
Patient advised as below.  

## 2017-03-04 NOTE — Telephone Encounter (Signed)
-----   Message from Mar Daring, [place] sent at 03/04/2017 10:26 AM EST ----- Cholesterol still borderline high but slight improvements from last year. All other labs are WNL and stable.

## 2017-04-07 DIAGNOSIS — H26499 Other secondary cataract, unspecified eye: Secondary | ICD-10-CM | POA: Diagnosis not present

## 2017-04-20 ENCOUNTER — Telehealth: Payer: Self-pay

## 2017-04-20 ENCOUNTER — Ambulatory Visit
Admission: RE | Admit: 2017-04-20 | Discharge: 2017-04-20 | Disposition: A | Discharge status: Home / Self Care | Payer: BLUE CROSS/BLUE SHIELD | Source: Ambulatory Visit | Attending: Physician Assistant | Admitting: Physician Assistant

## 2017-04-20 DIAGNOSIS — Z1239 Encounter for other screening for malignant neoplasm of breast: Secondary | ICD-10-CM

## 2017-04-20 DIAGNOSIS — Z780 Asymptomatic menopausal state: Secondary | ICD-10-CM

## 2017-04-20 DIAGNOSIS — Z13820 Encounter for screening for osteoporosis: Secondary | ICD-10-CM | POA: Diagnosis not present

## 2017-04-20 DIAGNOSIS — Z1231 Encounter for screening mammogram for malignant neoplasm of breast: Secondary | ICD-10-CM | POA: Insufficient documentation

## 2017-04-20 DIAGNOSIS — Z803 Family history of malignant neoplasm of breast: Secondary | ICD-10-CM | POA: Diagnosis not present

## 2017-04-20 DIAGNOSIS — E559 Vitamin D deficiency, unspecified: Secondary | ICD-10-CM | POA: Insufficient documentation

## 2017-04-20 DIAGNOSIS — M810 Age-related osteoporosis without current pathological fracture: Secondary | ICD-10-CM | POA: Diagnosis not present

## 2017-04-20 DIAGNOSIS — M8589 Other specified disorders of bone density and structure, multiple sites: Secondary | ICD-10-CM | POA: Diagnosis not present

## 2017-04-20 NOTE — Telephone Encounter (Signed)
-----   Message from Mar Daring, [place] sent at 04/20/2017 10:06 AM EST ----- Bone density shows osteoporosis. If interested in medications for treatment she will require an appt to discuss options. If not, she can make sure to be taking 1200mg  calcium daily, 800 IU Vit D daily and doing weight bearing exercises for bone health. Depending on if she chooses medical treatment vs conservative treatment will recheck BMD in 1-2 years.

## 2017-04-20 NOTE — Telephone Encounter (Signed)
-----   Message from Mar Daring, [place] sent at 04/20/2017 11:02 AM EST ----- Normal mammogram. Repeat screening in one year.

## 2017-04-20 NOTE — Telephone Encounter (Signed)
Per patient she saw the results and read message in My Chart.  Thanks,  -Elka Satterfield

## 2017-05-06 ENCOUNTER — Encounter: Payer: Self-pay | Admitting: Physician Assistant

## 2017-05-06 ENCOUNTER — Ambulatory Visit: Payer: BLUE CROSS/BLUE SHIELD | Admitting: Physician Assistant

## 2017-05-06 VITALS — BP 160/80 | HR 62 | Temp 97.6°F | Resp 16 | Ht 61.0 in | Wt 149.6 lb

## 2017-05-06 DIAGNOSIS — Z8249 Family history of ischemic heart disease and other diseases of the circulatory system: Secondary | ICD-10-CM | POA: Diagnosis not present

## 2017-05-06 DIAGNOSIS — E7800 Pure hypercholesterolemia, unspecified: Secondary | ICD-10-CM | POA: Diagnosis not present

## 2017-05-06 DIAGNOSIS — I208 Other forms of angina pectoris: Secondary | ICD-10-CM | POA: Diagnosis not present

## 2017-05-06 DIAGNOSIS — Z6828 Body mass index (BMI) 28.0-28.9, adult: Secondary | ICD-10-CM | POA: Diagnosis not present

## 2017-05-06 NOTE — Progress Notes (Signed)
       Patient: Taylor Goodwin Female    DOB: 1950-12-10   67 y.o.   MRN: [id number] Visit Date: 05/06/2017  Today's Provider: Mar Daring, PA-C   Chief Complaint  Patient presents with  . Shortness of Breath   Subjective:    HPI Patient here today C/O chest discomfort/tightness/heaviness and left arm tingling on and off times several months. There is no certain timing or trigger. It can occur at rest while watching tv, or at work. Patient reports that her siblings have heart issues with a sister just recently having emergent CAGB without any typical symptoms prior. Patient denies any chest pain, shortness of breath, headache or dizziness.    No Known Allergies   Current Outpatient Medications:  .  Calcium Carbonate-Vitamin D (CALCIUM HIGH POTENCY/VITAMIN D) 600-200 MG-UNIT TABS, Take by mouth., Disp: , Rfl:  .  loratadine (CLARITIN) 10 MG tablet, Take by mouth., Disp: , Rfl:  .  MULTIPLE VITAMIN PO, , Disp: , Rfl:   Review of Systems  Constitutional: Negative.   Respiratory: Negative.   Cardiovascular: Positive for chest pain.  Gastrointestinal: Negative.   Neurological: Negative.   Psychiatric/Behavioral: The patient is not nervous/anxious.     Social History   Tobacco Use  . Smoking status: Never Smoker  . Smokeless tobacco: Never Used  Substance Use Topics  . Alcohol use: No   Objective:   BP (!) 160/80 (BP Location: Left Arm, Patient Position: Sitting, Cuff Size: Large)   Pulse 62   Temp 97.6 F (36.4 C) (Oral)   Resp 16   Ht 5\' 1"  (1.549 m)   Wt 149 lb 9.6 oz (67.9 kg)   SpO2 99%   BMI 28.27 kg/m  Vitals:   05/06/17 1131  BP: (!) 160/80  Pulse: 62  Resp: 16  Temp: 97.6 F (36.4 C)  TempSrc: Oral  SpO2: 99%  Weight: 149 lb 9.6 oz (67.9 kg)  Height: 5\' 1"  (1.549 m)     Physical Exam  Constitutional: She appears well-developed and well-nourished. No distress.  Neck: Normal range of motion. Neck supple. No JVD present. No tracheal  deviation present. No thyromegaly present.  Cardiovascular: Normal rate, regular rhythm and normal heart sounds. Exam reveals no gallop and no friction rub.  No murmur heard. Pulmonary/Chest: Effort normal and breath sounds normal. No respiratory distress. She has no wheezes. She has no rales.  Lymphadenopathy:    She has no cervical adenopathy.  Skin: She is not diaphoretic.  Vitals reviewed.      Assessment & Plan:     1. Atypical angina (HCC) EKG today unremarkable with RSR NSR, no ST elevations or depressions rate of 62.  Will refer to cardiology for further evaluation and consideration of cardiac stress test to r/o cardiac involvement. Labs in December 2018 were fairly unremarkable.  - EKG 12-Lead - Ambulatory referral to Cardiology  2. Family history of heart disease Sister had CABG in January 2018.  - Ambulatory referral to Cardiology  3. BMI 28.0-28.9,adult Counseled patient on healthy lifestyle modifications including dieting and exercise.  - Ambulatory referral to Cardiology  4. Hypercholesterolemia Borderline.  - Ambulatory referral to Cardiology       Mar Daring, PA-C  [facility]

## 2017-05-06 NOTE — Patient Instructions (Signed)

## 2017-05-12 DIAGNOSIS — E663 Overweight: Secondary | ICD-10-CM | POA: Diagnosis not present

## 2017-05-12 DIAGNOSIS — E7800 Pure hypercholesterolemia, unspecified: Secondary | ICD-10-CM | POA: Diagnosis not present

## 2017-05-12 DIAGNOSIS — R0789 Other chest pain: Secondary | ICD-10-CM | POA: Diagnosis not present

## 2017-05-12 DIAGNOSIS — R0602 Shortness of breath: Secondary | ICD-10-CM | POA: Diagnosis not present

## 2017-05-13 DIAGNOSIS — R0602 Shortness of breath: Secondary | ICD-10-CM | POA: Insufficient documentation

## 2017-05-13 DIAGNOSIS — R0789 Other chest pain: Secondary | ICD-10-CM | POA: Insufficient documentation

## 2017-05-18 DIAGNOSIS — R0789 Other chest pain: Secondary | ICD-10-CM | POA: Diagnosis not present

## 2017-06-02 DIAGNOSIS — R0602 Shortness of breath: Secondary | ICD-10-CM | POA: Diagnosis not present

## 2018-02-18 DIAGNOSIS — Z23 Encounter for immunization: Secondary | ICD-10-CM | POA: Diagnosis not present

## 2018-03-16 ENCOUNTER — Other Ambulatory Visit: Payer: Self-pay | Admitting: Physician Assistant

## 2018-03-16 DIAGNOSIS — Z1231 Encounter for screening mammogram for malignant neoplasm of breast: Secondary | ICD-10-CM

## 2018-04-05 ENCOUNTER — Ambulatory Visit (INDEPENDENT_AMBULATORY_CARE_PROVIDER_SITE_OTHER): Payer: BLUE CROSS/BLUE SHIELD | Admitting: Physician Assistant

## 2018-04-05 ENCOUNTER — Encounter: Payer: Self-pay | Admitting: Physician Assistant

## 2018-04-05 VITALS — BP 118/76 | HR 69 | Temp 98.5°F | Resp 16 | Ht 61.0 in | Wt 144.0 lb

## 2018-04-05 DIAGNOSIS — E7800 Pure hypercholesterolemia, unspecified: Secondary | ICD-10-CM

## 2018-04-05 DIAGNOSIS — R748 Abnormal levels of other serum enzymes: Secondary | ICD-10-CM | POA: Diagnosis not present

## 2018-04-05 DIAGNOSIS — Z0000 Encounter for general adult medical examination without abnormal findings: Secondary | ICD-10-CM | POA: Diagnosis not present

## 2018-04-05 DIAGNOSIS — Z1239 Encounter for other screening for malignant neoplasm of breast: Secondary | ICD-10-CM

## 2018-04-05 DIAGNOSIS — E119 Type 2 diabetes mellitus without complications: Secondary | ICD-10-CM | POA: Diagnosis not present

## 2018-04-05 DIAGNOSIS — Z23 Encounter for immunization: Secondary | ICD-10-CM

## 2018-04-05 DIAGNOSIS — E663 Overweight: Secondary | ICD-10-CM

## 2018-04-05 DIAGNOSIS — E559 Vitamin D deficiency, unspecified: Secondary | ICD-10-CM

## 2018-04-05 LAB — POCT UA - MICROALBUMIN: Microalbumin Ur, POC: 20 mg/L

## 2018-04-05 NOTE — Progress Notes (Signed)
Patient: Taylor Goodwin, Female    DOB: Jun 12, 1950, 68 y.o.   MRN: [id number] Visit Date: 04/05/2018  Today's Provider: Mar Daring, PA-C   Chief Complaint  Patient presents with  . Annual Exam   Subjective:    Annual physical exam Taylor Goodwin is a 68 y.o. female who presents today for health maintenance and complete physical. She feels well. She reports exercising not regularly. She reports she is sleeping well. -----------------------------------------------------------------   Review of Systems  Constitutional: Negative.   HENT: Negative.   Eyes: Negative.   Respiratory: Negative.   Cardiovascular: Negative.   Gastrointestinal: Negative.   Endocrine: Negative.   Genitourinary: Negative.   Musculoskeletal: Negative.   Skin: Negative.   Allergic/Immunologic: Negative.   Neurological: Negative.   Hematological: Negative.   Psychiatric/Behavioral: Negative.     Social History      She  reports that she has never smoked. She has never used smokeless tobacco. She reports that she does not drink alcohol or use drugs.       Social History   Socioeconomic History  . Marital status: Married    Spouse name: Not on file  . Number of children: Not on file  . Years of education: Not on file  . Highest education level: Not on file  Occupational History  . Not on file  Social Needs  . Financial resource strain: Not on file  . Food insecurity:    Worry: Not on file    Inability: Not on file  . Transportation needs:    Medical: Not on file    Non-medical: Not on file  Tobacco Use  . Smoking status: Never Smoker  . Smokeless tobacco: Never Used  Substance and Sexual Activity  . Alcohol use: No  . Drug use: No  . Sexual activity: Not on file  Lifestyle  . Physical activity:    Days per week: Not on file    Minutes per session: Not on file  . Stress: Not on file  Relationships  . Social connections:    Talks on phone: Not on file    Gets  together: Not on file    Attends religious service: Not on file    Active member of club or organization: Not on file    Attends meetings of clubs or organizations: Not on file    Relationship status: Not on file  Other Topics Concern  . Not on file  Social History Narrative  . Not on file    History reviewed. No pertinent past medical history.   Patient Active Problem List   Diagnosis Date Noted  . Overweight (BMI 25.0-29.9) 02/13/2015  . Abnormal liver enzymes 02/13/2015  . Blood pressure elevated without history of HTN 02/13/2015  . Hypercholesteremia 02/13/2015  . Avitaminosis D 02/13/2015  . Diabetes mellitus, type 2 ([place]) 05/10/2007    Past Surgical History:  Procedure Laterality Date  . ABDOMINAL HYSTERECTOMY    . CESAREAN SECTION     x 2  . EYE SURGERY Bilateral 09/2014   cataract [facility]  . TUBAL LIGATION      Family History        Family Status  Relation Name Status  . Mother  Deceased  . Father  Deceased  . Brother  Alive  . Brother  Alive  . Other  (Not Specified)  . Sister  Alive        Her family history includes Bipolar disorder in her father; Breast  cancer (age of onset: 46) in her mother; Diabetes in her sister and another family member; Heart disease in her brother, brother, and sister; Hypertension in her brother, brother, and father; Schizophrenia in her father.      No Known Allergies   Current Outpatient Medications:  .  Calcium Carbonate-Vitamin D (CALCIUM HIGH POTENCY/VITAMIN D) 600-200 MG-UNIT TABS, Take by mouth., Disp: , Rfl:  .  loratadine (CLARITIN) 10 MG tablet, Take by mouth., Disp: , Rfl:  .  MULTIPLE VITAMIN PO, , Disp: , Rfl:    Patient Care Team: Mar Daring, PA-C as PCP - General (Family Medicine)      Objective:   Vitals: BP 118/76 (BP Location: Right Arm, Patient Position: Sitting, Cuff Size: Large)   Pulse 69   Temp 98.5 F (36.9 C) (Oral)   Resp 16   Ht 5\' 1"  (1.549 m)   Wt 144 lb (65.3 kg)   SpO2 99%    BMI 27.21 kg/m    Vitals:   04/05/18 1620  BP: 118/76  Pulse: 69  Resp: 16  Temp: 98.5 F (36.9 C)  TempSrc: Oral  SpO2: 99%  Weight: 144 lb (65.3 kg)  Height: 5\' 1"  (1.549 m)     Physical Exam Vitals signs reviewed.  Constitutional:      General: She is not in acute distress.    Appearance: She is well-developed. She is not diaphoretic.  HENT:     Head: Normocephalic and atraumatic.     Right Ear: Hearing, tympanic membrane, ear canal and external ear normal.     Left Ear: Hearing, tympanic membrane, ear canal and external ear normal.     Nose: Nose normal.     Mouth/Throat:     Lips: Pink.     Pharynx: Oropharynx is clear. Uvula midline. No oropharyngeal exudate or posterior oropharyngeal erythema.  Eyes:     General: No scleral icterus.       Right eye: No discharge.        Left eye: No discharge.     Conjunctiva/sclera: Conjunctivae normal.     Pupils: Pupils are equal, round, and reactive to light.  Neck:     Musculoskeletal: Normal range of motion and neck supple.     Thyroid: No thyromegaly.     Vascular: No carotid bruit or JVD.     Trachea: No tracheal deviation.  Cardiovascular:     Rate and Rhythm: Normal rate and regular rhythm.     Heart sounds: Normal heart sounds. No murmur. No friction rub. No gallop.   Pulmonary:     Effort: Pulmonary effort is normal. No respiratory distress.     Breath sounds: Normal breath sounds. No wheezing or rales.  Chest:     Chest wall: No tenderness.     Breasts:        Right: Normal. No mass, skin change or tenderness.        Left: Normal. No mass, skin change or tenderness.  Abdominal:     General: Bowel sounds are normal. There is no distension.     Palpations: Abdomen is soft. There is no mass.     Tenderness: There is no abdominal tenderness. There is no guarding or rebound.  Musculoskeletal: Normal range of motion.        General: No tenderness.  Lymphadenopathy:     Cervical: No cervical adenopathy.      Upper Body:     Right upper body: No supraclavicular, axillary or pectoral adenopathy.  Left upper body: No supraclavicular, axillary or pectoral adenopathy.  Skin:    General: Skin is warm and dry.     Findings: No rash.  Neurological:     Mental Status: She is alert and oriented to person, place, and time.  Psychiatric:        Behavior: Behavior normal.        Thought Content: Thought content normal.        Judgment: Judgment normal.      Depression Screen PHQ 2/9 Scores 03/03/2017 02/16/2015  PHQ - 2 Score 0 0      Assessment & Plan:     Routine Health Maintenance and Physical Exam  Exercise Activities and Dietary recommendations Goals    . Exercise 150 minutes per week (moderate activity)       Immunization History  Administered Date(s) Administered  . Influenza-Unspecified 01/02/2017  . Tdap 05/01/2009    Health Maintenance  Topic Date Due  . OPHTHALMOLOGY EXAM  02/14/1963  . HEMOGLOBIN A1C  09/01/2017  . INFLUENZA VACCINE  10/29/2017  . PNA vac Low Risk Adult (1 of 2 - PCV13) 02/13/2018  . FOOT EXAM  03/03/2018  . URINE MICROALBUMIN  03/03/2018  . MAMMOGRAM  04/21/2019  . TETANUS/TDAP  05/02/2019  . COLONOSCOPY  03/27/2024  . DEXA SCAN  Completed  . Hepatitis C Screening  Completed  . HIV Screening  Completed  . PAP SMEAR-Modifier  Discontinued     Discussed health benefits of physical activity, and encouraged her to engage in regular exercise appropriate for her age and condition.    1. Annual physical exam Normal physical exam today. Will check labs as below and f/u pending lab results. If labs are stable and WNL she will not need to have these rechecked for one year at her next annual physical exam. She is to call the office in the meantime if she has any acute issue, questions or concerns. - CBC w/Diff/Platelet - Comprehensive Metabolic Panel (CMET) - Lipid Profile - HgB A1c - TSH  2. Breast cancer screening Mammogram already scheduled.  Breast exam normal today. Does perform self breast exams.   3. Type 2 diabetes mellitus without complication, unspecified whether long term insulin use ([place]) Diet controlled. Microalbumin normal. Will check labs as below and f/u pending results. - CBC w/Diff/Platelet - Comprehensive Metabolic Panel (CMET) - Lipid Profile - HgB A1c - TSH - POCT UA - Microalbumin  4. Abnormal liver enzymes Will check labs as below and f/u pending results. - CBC w/Diff/Platelet - Comprehensive Metabolic Panel (CMET) - Lipid Profile - HgB A1c - TSH  5. Avitaminosis D H/O this with osteoporosis and postmenopausal. Will check labs as below and f/u pending results. - CBC w/Diff/Platelet - Comprehensive Metabolic Panel (CMET) - Lipid Profile - HgB A1c - TSH - Vitamin D (25 hydroxy)  6. Hypercholesteremia Diet controlled. Will check labs as below and f/u pending results. - CBC w/Diff/Platelet - Comprehensive Metabolic Panel (CMET) - Lipid Profile - HgB A1c - TSH  7. Overweight (BMI 25.0-29.9) Counseled patient on healthy lifestyle modifications including dieting and exercise.  - CBC w/Diff/Platelet - Comprehensive Metabolic Panel (CMET) - Lipid Profile - HgB A1c - TSH  8. Need for pneumococcal vaccination Pneumococcal 23 Vaccine given to patient without complications. Patient sat for 15 minutes after administration and was tolerated well without adverse effects. - Pneumococcal polysaccharide vaccine 23-valent greater than or equal to 2yo subcutaneous/IM  --------------------------------------------------------------------    Mar Daring, PA-C  [facility]  [facility]  

## 2018-04-05 NOTE — Patient Instructions (Signed)

## 2018-04-21 ENCOUNTER — Ambulatory Visit
Admission: RE | Admit: 2018-04-21 | Discharge: 2018-04-21 | Disposition: A | Discharge status: Home / Self Care | Payer: BLUE CROSS/BLUE SHIELD | Source: Ambulatory Visit | Attending: Physician Assistant | Admitting: Physician Assistant

## 2018-04-21 DIAGNOSIS — Z1231 Encounter for screening mammogram for malignant neoplasm of breast: Secondary | ICD-10-CM | POA: Insufficient documentation

## 2018-04-22 ENCOUNTER — Telehealth: Payer: Self-pay

## 2018-04-22 NOTE — Telephone Encounter (Signed)
-----   Message from Mar Daring, [place] sent at 04/21/2018  6:48 PM EST ----- Normal mammogram. Repeat screening in one year.

## 2018-04-22 NOTE — Telephone Encounter (Signed)
Patient was advised.  

## 2018-05-19 DIAGNOSIS — E7800 Pure hypercholesterolemia, unspecified: Secondary | ICD-10-CM | POA: Diagnosis not present

## 2018-05-19 DIAGNOSIS — R748 Abnormal levels of other serum enzymes: Secondary | ICD-10-CM | POA: Diagnosis not present

## 2018-05-19 DIAGNOSIS — E559 Vitamin D deficiency, unspecified: Secondary | ICD-10-CM | POA: Diagnosis not present

## 2018-05-19 DIAGNOSIS — Z0000 Encounter for general adult medical examination without abnormal findings: Secondary | ICD-10-CM | POA: Diagnosis not present

## 2018-05-19 DIAGNOSIS — E663 Overweight: Secondary | ICD-10-CM | POA: Diagnosis not present

## 2018-05-19 DIAGNOSIS — E119 Type 2 diabetes mellitus without complications: Secondary | ICD-10-CM | POA: Diagnosis not present

## 2018-05-20 LAB — LIPID PANEL
Chol/HDL Ratio: 3.6 ratio (ref 0.0–4.4)
Cholesterol, Total: 231 mg/dL — ABNORMAL HIGH (ref 100–199)
HDL: 64 mg/dL (ref >39)
LDL Calculated: 140 mg/dL — ABNORMAL HIGH (ref 0–99)
TRIGLYCERIDES: 137 mg/dL (ref 0–149)
VLDL Cholesterol Cal: 27 mg/dL (ref 5–40)

## 2018-05-20 LAB — CBC WITH DIFFERENTIAL/PLATELET
BASOS ABS: 0 10*3/uL (ref 0.0–0.2)
Basos: 1 %
EOS (ABSOLUTE): 0.1 10*3/uL (ref 0.0–0.4)
EOS: 2 %
Hematocrit: 39.8 % (ref 34.0–46.6)
Hemoglobin: 13.9 g/dL (ref 11.1–15.9)
IMMATURE GRANULOCYTES: 0 %
Immature Grans (Abs): 0 10*3/uL (ref 0.0–0.1)
LYMPHS ABS: 2.6 10*3/uL (ref 0.7–3.1)
Lymphs: 34 %
MCH: 31 pg (ref 26.6–33.0)
MCHC: 34.9 g/dL (ref 31.5–35.7)
MCV: 89 fL (ref 79–97)
MONOCYTES: 6 %
Monocytes Absolute: 0.5 10*3/uL (ref 0.1–0.9)
Neutrophils Absolute: 4.4 10*3/uL (ref 1.4–7.0)
Neutrophils: 57 %
Platelets: 244 10*3/uL (ref 150–450)
RBC: 4.49 x10E6/uL (ref 3.77–5.28)
RDW: 11.8 % (ref 11.7–15.4)
WBC: 7.6 10*3/uL (ref 3.4–10.8)

## 2018-05-20 LAB — COMPREHENSIVE METABOLIC PANEL
A/G RATIO: 2 (ref 1.2–2.2)
ALT: 14 IU/L (ref 0–32)
AST: 23 IU/L (ref 0–40)
Albumin: 5 g/dL — ABNORMAL HIGH (ref 3.8–4.8)
Alkaline Phosphatase: 75 IU/L (ref 39–117)
BILIRUBIN TOTAL: 0.5 mg/dL (ref 0.0–1.2)
BUN/Creatinine Ratio: 18 (ref 12–28)
BUN: 13 mg/dL (ref 8–27)
CHLORIDE: 98 mmol/L (ref 96–106)
CO2: 25 mmol/L (ref 20–29)
Calcium: 10.1 mg/dL (ref 8.7–10.3)
Creatinine, Ser: 0.73 mg/dL (ref 0.57–1.00)
GFR calc non Af Amer: 87 mL/min/{1.73_m2} (ref >59)
GFR, EST AFRICAN AMERICAN: 100 mL/min/{1.73_m2} (ref >59)
Globulin, Total: 2.5 g/dL (ref 1.5–4.5)
Glucose: 76 mg/dL (ref 65–99)
POTASSIUM: 4.2 mmol/L (ref 3.5–5.2)
Sodium: 140 mmol/L (ref 134–144)
Total Protein: 7.5 g/dL (ref 6.0–8.5)

## 2018-05-20 LAB — HEMOGLOBIN A1C
Est. average glucose Bld gHb Est-mCnc: 114 mg/dL
HEMOGLOBIN A1C: 5.6 % (ref 4.8–5.6)

## 2018-05-20 LAB — VITAMIN D 25 HYDROXY (VIT D DEFICIENCY, FRACTURES): VIT D 25 HYDROXY: 32.3 ng/mL (ref 30.0–100.0)

## 2018-05-20 LAB — TSH: TSH: 0.825 u[IU]/mL (ref 0.450–4.500)

## 2018-05-21 ENCOUNTER — Telehealth: Payer: Self-pay

## 2018-05-21 NOTE — Telephone Encounter (Signed)
-----   Message from Mar Daring, [place] sent at 05/21/2018  3:56 PM EST ----- Blood count is normal. Kidney and liver function are normal. Sugar is normal. Cholesterol is up slightly from last year. Current risk of cardiovascular event is low at 4.8% chance over the next 10 years. So currently no need for cholesterol lowering medications. Make sure to limit fatty foods, carbs and red meats in diet. Also increasing physical activity slowly as tolerated can help as well. Thyroid is normal. Vit D is normal.

## 2018-05-21 NOTE — Telephone Encounter (Signed)
Patient advised as directed below. 

## 2018-10-06 DIAGNOSIS — H18602 Keratoconus, unspecified, left eye: Secondary | ICD-10-CM | POA: Diagnosis not present

## 2018-12-21 IMAGING — MG MM DIGITAL SCREENING BILAT W/ CAD
4 series · 4 of 4 positions shown · non-contrast
Comparison: Previous exam(s).

CLINICAL DATA: Screening.

EXAM:
DIGITAL SCREENING BILATERAL MAMMOGRAM WITH CAD

[L MLO]
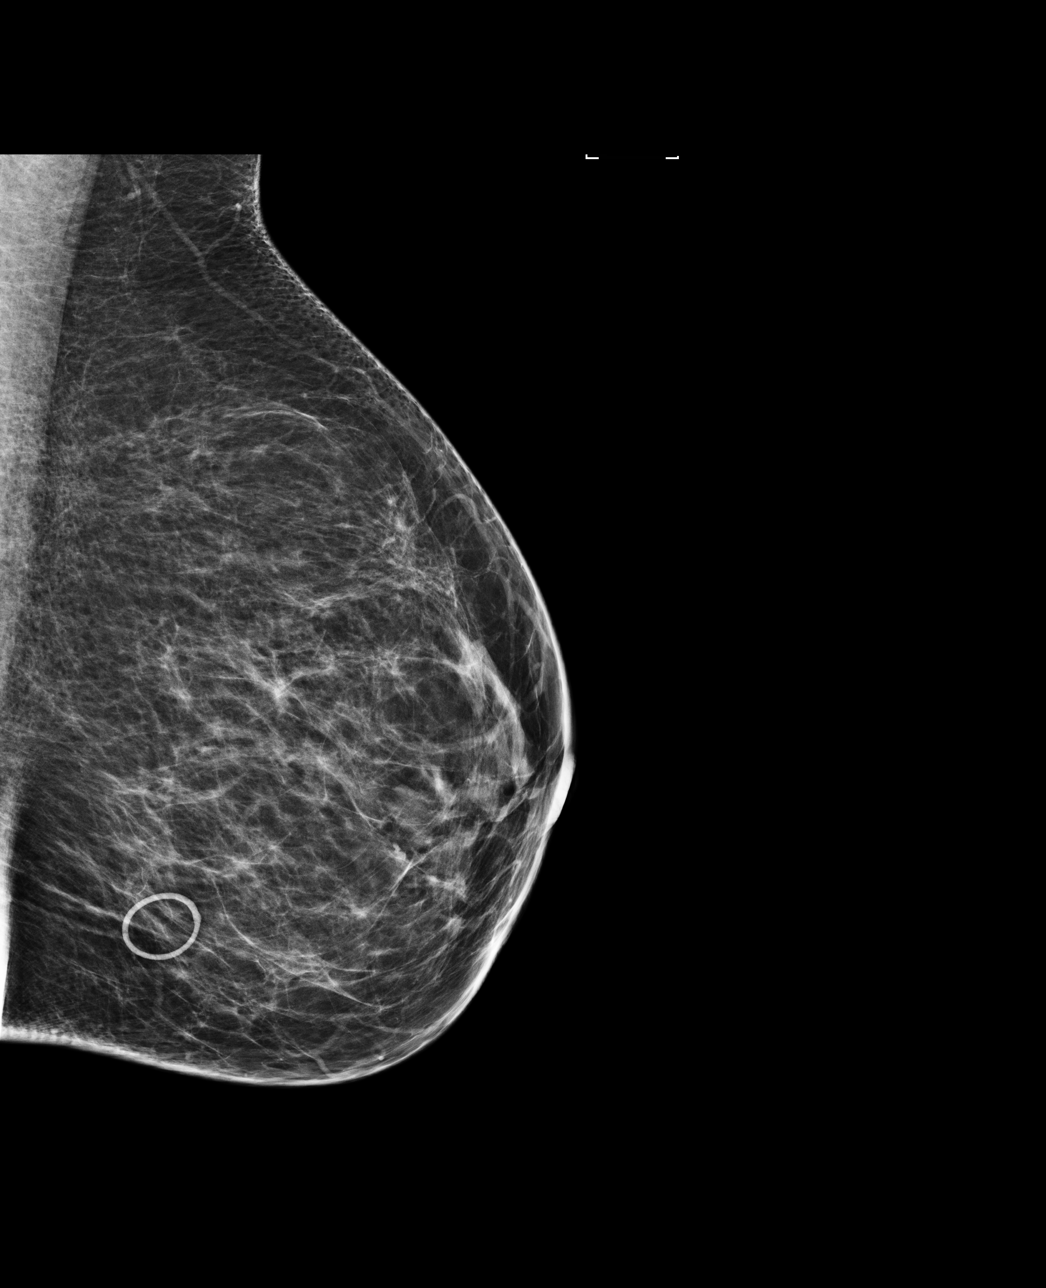

[R MLO]
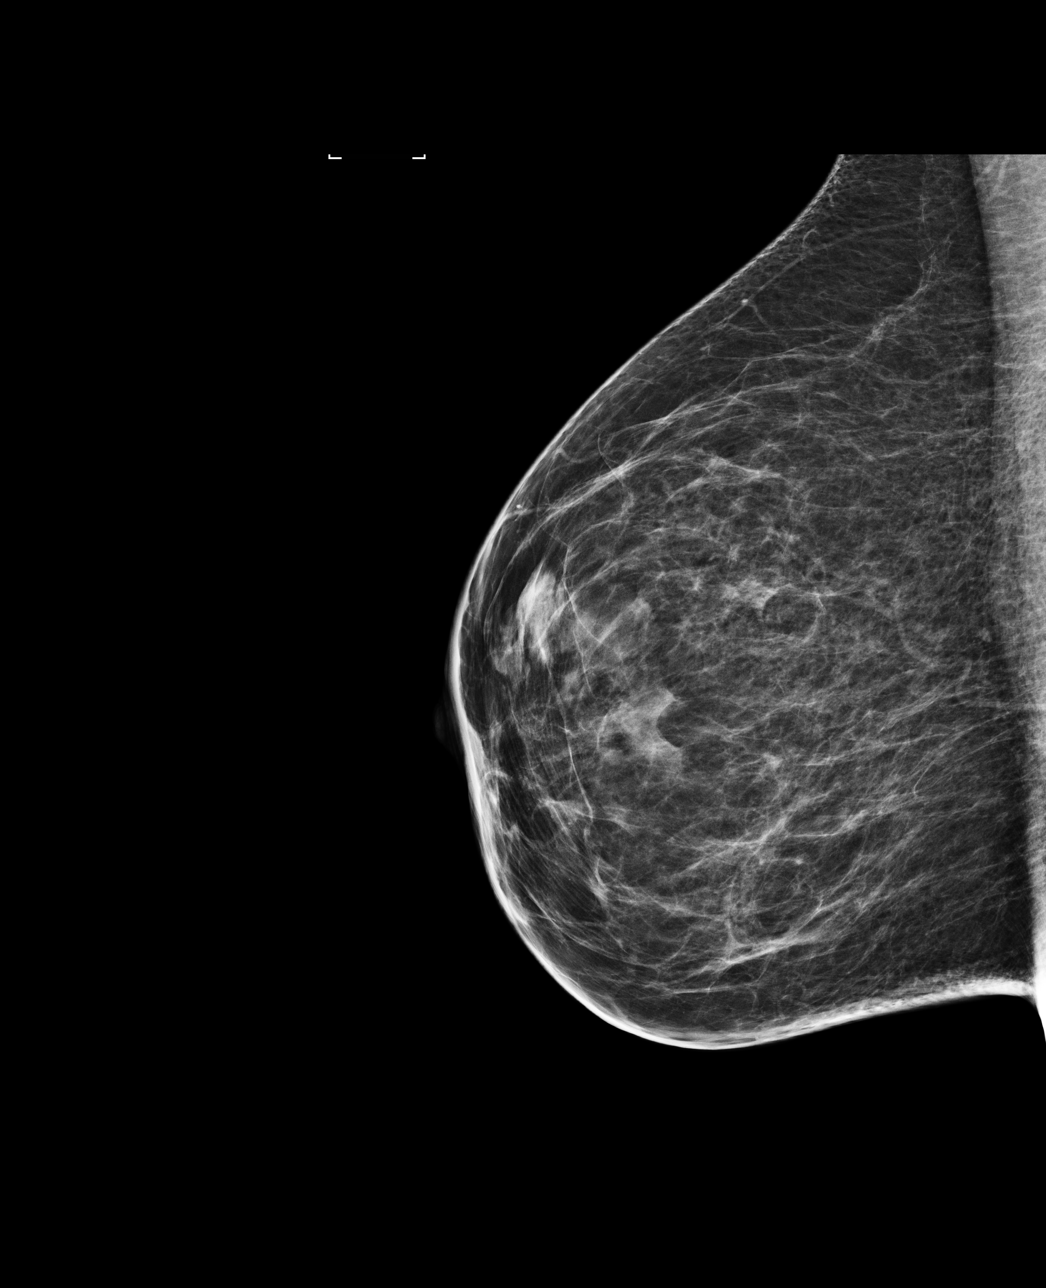

[R CC]
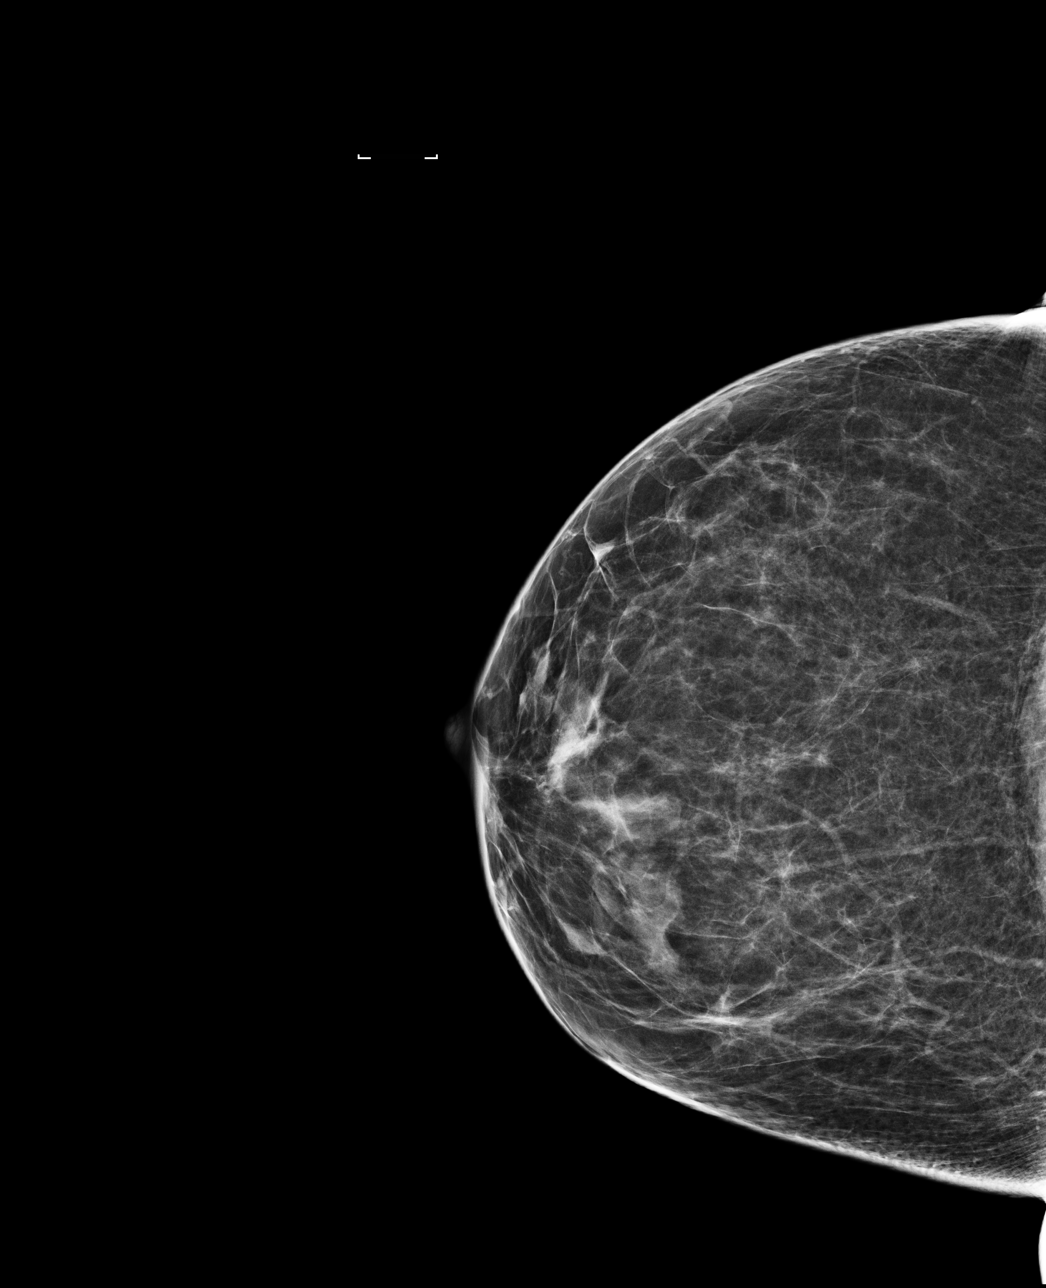

[L CC]
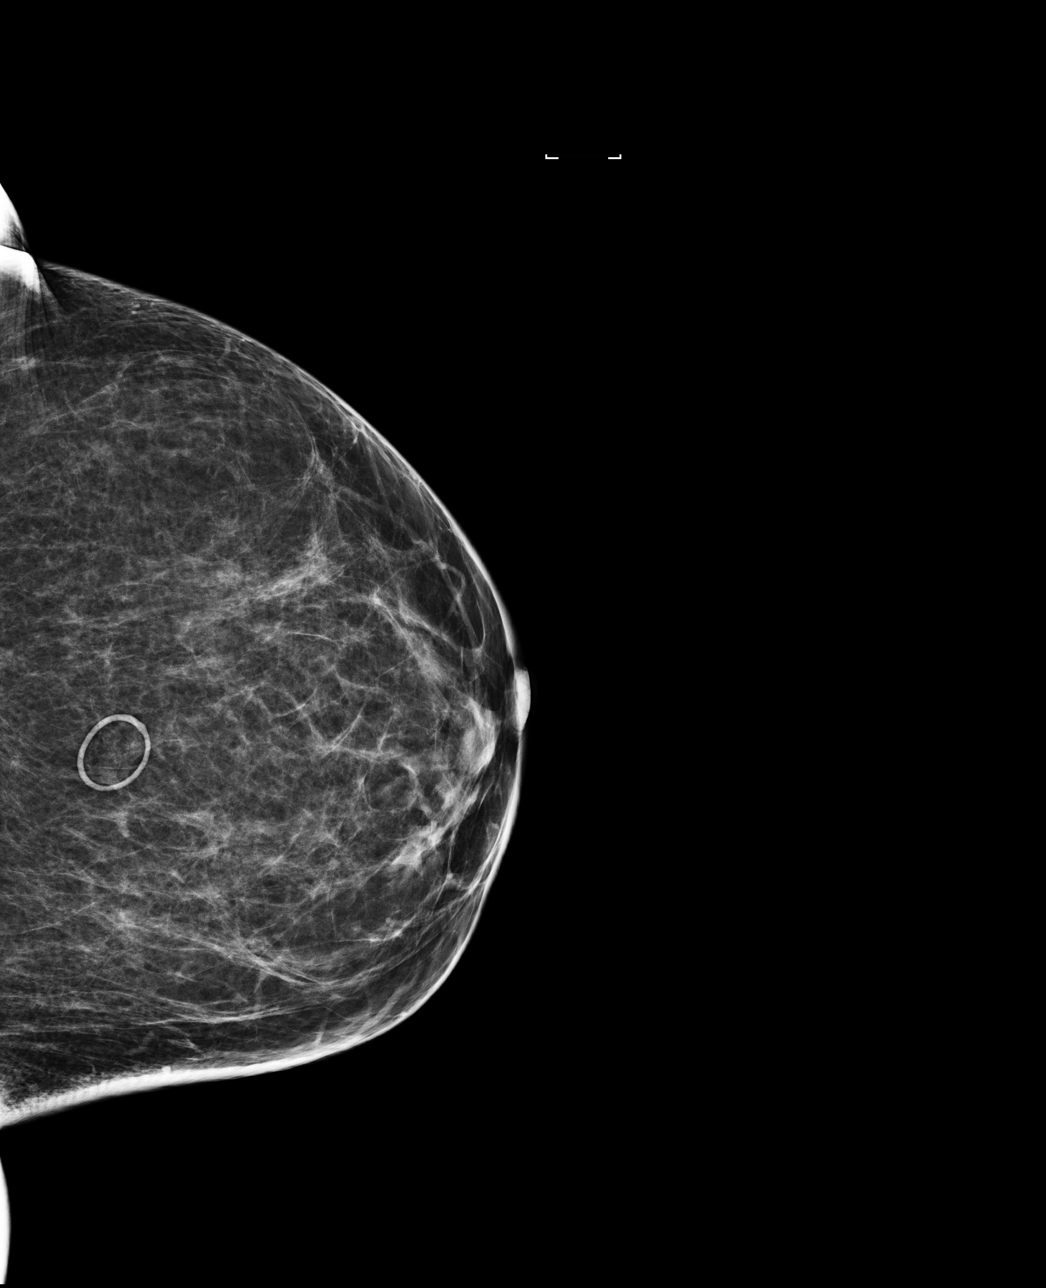

[4 of 4 positions shown; findings below may reference images not displayed]

ACR Breast Density Category b: There are scattered areas of
fibroglandular density.
FINDINGS: There are no findings suspicious for malignancy. Images were
processed with CAD.
IMPRESSION: No mammographic evidence of malignancy. A result letter of this
screening mammogram will be mailed directly to the patient.

RECOMMENDATION:
Screening mammogram in one year. (Code:AS-G-LCT)

BI-RADS CATEGORY  1: Negative.

## 2018-12-31 DIAGNOSIS — Z23 Encounter for immunization: Secondary | ICD-10-CM | POA: Diagnosis not present

## 2019-03-30 ENCOUNTER — Other Ambulatory Visit: Payer: Self-pay | Admitting: Physician Assistant

## 2019-03-30 DIAGNOSIS — Z23 Encounter for immunization: Secondary | ICD-10-CM | POA: Diagnosis not present

## 2019-03-30 DIAGNOSIS — Z1231 Encounter for screening mammogram for malignant neoplasm of breast: Secondary | ICD-10-CM

## 2019-04-25 ENCOUNTER — Ambulatory Visit
Admission: RE | Admit: 2019-04-25 | Discharge: 2019-04-25 | Disposition: A | Discharge status: Home / Self Care | Payer: BC Managed Care – PPO | Source: Ambulatory Visit | Attending: Physician Assistant | Admitting: Physician Assistant

## 2019-04-25 ENCOUNTER — Encounter: Payer: BLUE CROSS/BLUE SHIELD | Admitting: Physician Assistant

## 2019-04-25 DIAGNOSIS — Z1231 Encounter for screening mammogram for malignant neoplasm of breast: Secondary | ICD-10-CM | POA: Diagnosis not present

## 2019-04-27 ENCOUNTER — Telehealth: Payer: Self-pay

## 2019-04-27 NOTE — Telephone Encounter (Signed)
-----   Message from Mar Daring, [place] sent at 04/27/2019  2:00 PM EST ----- Normal mammogram. Repeat screening in one year.

## 2019-04-27 NOTE — Telephone Encounter (Signed)
Pt advised.   Thanks,   -Armin Yerger  

## 2019-05-13 DIAGNOSIS — Z23 Encounter for immunization: Secondary | ICD-10-CM | POA: Diagnosis not present

## 2019-06-10 DIAGNOSIS — Z23 Encounter for immunization: Secondary | ICD-10-CM | POA: Diagnosis not present

## 2019-06-16 ENCOUNTER — Encounter: Payer: BLUE CROSS/BLUE SHIELD | Admitting: Physician Assistant

## 2019-06-30 NOTE — Progress Notes (Signed)
Patient: Taylor Goodwin, Female    DOB: 1950-08-20, 69 y.o.   MRN: [id number] Visit Date: 07/01/2019  Today's Provider: Mar Daring, PA-C   Chief Complaint  Patient presents with  . Annual Exam   Subjective:     Annual physical exam Taylor Goodwin is a 69 y.o. female who presents today for health maintenance and complete physical. She feels well. She reports exercising none. She reports she is sleeping well.  ----------------------------------------------------------------- 04/27/19-Normal mammogram. Repeat screening in one year 03/27/14-4 polyps. Repeat 5 years due to adenoma. Dr. Allen Norris   Review of Systems  Constitutional: Positive for fatigue.  HENT: Negative.   Eyes: Negative.   Respiratory: Negative.   Cardiovascular: Negative.   Gastrointestinal: Negative.   Endocrine: Negative.   Genitourinary: Negative.   Musculoskeletal: Negative.   Skin: Negative.   Allergic/Immunologic: Negative.   Neurological: Negative.   Hematological: Negative.   Psychiatric/Behavioral: Positive for sleep disturbance ("sleeping more").    Social History      She  reports that she has never smoked. She has never used smokeless tobacco. She reports that she does not drink alcohol or use drugs.       Social History   Socioeconomic History  . Marital status: Married    Spouse name: Not on file  . Number of children: Not on file  . Years of education: Not on file  . Highest education level: Not on file  Occupational History  . Not on file  Tobacco Use  . Smoking status: Never Smoker  . Smokeless tobacco: Never Used  Substance and Sexual Activity  . Alcohol use: No  . Drug use: No  . Sexual activity: Not on file  Other Topics Concern  . Not on file  Social History Narrative  . Not on file   Social Determinants of Health   Financial Resource Strain:   . Difficulty of Paying Living Expenses:   Food Insecurity:   . Worried About Charity fundraiser in the Last  Year:   . Arboriculturist in the Last Year:   Transportation Needs:   . Film/video editor (Medical):   Marland Kitchen Lack of Transportation (Non-Medical):   Physical Activity:   . Days of Exercise per Week:   . Minutes of Exercise per Session:   Stress:   . Feeling of Stress :   Social Connections:   . Frequency of Communication with Friends and Family:   . Frequency of Social Gatherings with Friends and Family:   . Attends Religious Services:   . Active Member of Clubs or Organizations:   . Attends Archivist Meetings:   Marland Kitchen Marital Status:     History reviewed. No pertinent past medical history.   Patient Active Problem List   Diagnosis Date Noted  . Overweight (BMI 25.0-29.9) 02/13/2015  . Abnormal liver enzymes 02/13/2015  . Blood pressure elevated without history of HTN 02/13/2015  . Hypercholesteremia 02/13/2015  . Avitaminosis D 02/13/2015  . Diabetes mellitus, type 2 ([place]) 05/10/2007    Past Surgical History:  Procedure Laterality Date  . ABDOMINAL HYSTERECTOMY    . CESAREAN SECTION     x 2  . EYE SURGERY Bilateral 09/2014   cataract [facility]  . TUBAL LIGATION      Family History        Family Status  Relation Name Status  . Mother  Deceased  . Father  Deceased  . Brother  Alive  .  Brother  Alive  . Other  (Not Specified)  . Sister  Alive        Her family history includes Bipolar disorder in her father; Breast cancer (age of onset: 48) in her mother; Diabetes in her sister and another family member; Heart disease in her brother, brother, and sister; Hypertension in her brother, brother, and father; Schizophrenia in her father.      No Known Allergies   Current Outpatient Medications:  .  Calcium Carbonate-Vitamin D (CALCIUM HIGH POTENCY/VITAMIN D) 600-200 MG-UNIT TABS, Take by mouth., Disp: , Rfl:  .  loratadine (CLARITIN) 10 MG tablet, Take by mouth., Disp: , Rfl:  .  MULTIPLE VITAMIN PO, , Disp: , Rfl:    Patient Care Team: Rubye Beach as PCP - General (Family Medicine)    Objective:    Vitals: BP (!) 150/87 (BP Location: Left Arm, Patient Position: Sitting, Cuff Size: Large)   Pulse 67   Temp (!) 97.5 F (36.4 C) (Temporal)   Resp 16   Ht 5' 1.5" (1.562 m)   Wt 163 lb 12.8 oz (74.3 kg)   BMI 30.45 kg/m    Vitals:   07/01/19 0729  BP: (!) 150/87  Pulse: 67  Resp: 16  Temp: (!) 97.5 F (36.4 C)  TempSrc: Temporal  Weight: 163 lb 12.8 oz (74.3 kg)  Height: 5' 1.5" (1.562 m)     Physical Exam Constitutional:      Appearance: Normal appearance.  HENT:     Head: Normocephalic and atraumatic.     Right Ear: Tympanic membrane, ear canal and external ear normal.     Left Ear: Tympanic membrane, ear canal and external ear normal.     Nose: Nose normal.     Mouth/Throat:     Mouth: Mucous membranes are moist.     Pharynx: Oropharynx is clear.  Eyes:     Extraocular Movements: Extraocular movements intact.     Conjunctiva/sclera: Conjunctivae normal.     Pupils: Pupils are equal, round, and reactive to light.  Cardiovascular:     Rate and Rhythm: Normal rate and regular rhythm.     Pulses: Normal pulses.     Heart sounds: Normal heart sounds.  Pulmonary:     Effort: Pulmonary effort is normal.     Breath sounds: Normal breath sounds.  Abdominal:     General: Bowel sounds are normal.  Musculoskeletal:        General: Normal range of motion.     Cervical back: Normal range of motion and neck supple.  Skin:    General: Skin is warm and dry.  Neurological:     Mental Status: She is alert and oriented to person, place, and time.  Psychiatric:        Mood and Affect: Mood normal.        Behavior: Behavior normal.        Thought Content: Thought content normal.        Judgment: Judgment normal.      Depression Screen PHQ 2/9 Scores 07/01/2019 04/05/2018 03/03/2017 02/16/2015  PHQ - 2 Score 0 0 0 0  PHQ- 9 Score - 0 - -       Assessment & Plan:     Routine Health Maintenance and Physical  Exam  Exercise Activities and Dietary recommendations Goals    . Exercise 150 minutes per week (moderate activity)       Immunization History  Administered Date(s) Administered  . Influenza  Inj Mdck Quad Pf 02/18/2018  . Influenza Split 03/06/2007  . Influenza,inj,Quad PF,6+ Mos 12/31/2018  . Influenza-Unspecified 12/29/2013, 01/02/2017  . Moderna SARS-COVID-2 Vaccination 06/10/2019  . Pneumococcal Polysaccharide-23 04/05/2018  . Tdap 05/01/2009  . Zoster Recombinat (Shingrix) 12/31/2018, 03/30/2019    Health Maintenance  Topic Date Due  . OPHTHALMOLOGY EXAM  Never done  . FOOT EXAM  03/03/2018  . HEMOGLOBIN A1C  11/17/2018  . URINE MICROALBUMIN  04/06/2019  . PNA vac Low Risk Adult (2 of 2 - PCV13) 04/06/2019  . TETANUS/TDAP  05/02/2019  . INFLUENZA VACCINE  10/30/2019  . MAMMOGRAM  04/24/2021  . COLONOSCOPY  03/27/2024  . DEXA SCAN  Completed  . Hepatitis C Screening  Completed     Discussed health benefits of physical activity, and encouraged her to engage in regular exercise appropriate for her age and condition.    1. Annual physical exam Normal physical exam today. Will check labs as below and f/u pending lab results. If labs are stable and WNL she will not need to have these rechecked for one year at her next annual physical exam. Patient will return in 2-3 weeks for Prevnar 13 and Td booster, unable to get today as she had 2nd covid 19 vaccine 2 weeks ago. She is to call the office in the meantime if she has any acute issue, questions or concerns. - TSH  2. Encounter for breast cancer screening using non-mammogram modality Mammogram in Jan 2021 was normal. Repeat annually. Family history in mother.   3. Colon cancer screening Due for repeat colonoscopy. Referral to Dr. Allen Norris placed.  - Ambulatory referral to Gastroenterology  4. Type 2 diabetes mellitus without complication, unspecified whether long term insulin use ([place]) Had been diet controlled. Has gained  some weight through pandemic. Will continue to work on lifestyle modifications. Will check labs as below and f/u pending results. - CBC with Differential/Platelet - Comprehensive metabolic panel - Hemoglobin A1c - POCT UA - Microalbumin  5. Class 1 obesity due to excess calories with serious comorbidity and body mass index (BMI) of 30.0 to 30.9 in adult Counseled patient on healthy lifestyle modifications including dieting and exercise.  - CBC with Differential/Platelet - Comprehensive metabolic panel - Hemoglobin A1c  6. Abnormal liver enzymes H/O this. Will check labs as below and f/u pending results. - CBC with Differential/Platelet - Comprehensive metabolic panel - Lipid panel  7. Hypercholesteremia Diet controlled. Will check labs as below and f/u pending results. - CBC with Differential/Platelet - Comprehensive metabolic panel - Lipid panel  8. Avitaminosis D On OTC supplement and postmenopausal. Will check labs as below and f/u pending results. - CBC with Differential/Platelet  9. Blood pressure elevated without history of HTN Elevated today. Discussed monitoring at home and sending mychart message if consistently over 140 in SBP. She agrees.  - CBC with Differential/Platelet - Comprehensive metabolic panel  --------------------------------------------------------------------    Mar Daring, PA-C  [facility]

## 2019-07-01 ENCOUNTER — Ambulatory Visit (INDEPENDENT_AMBULATORY_CARE_PROVIDER_SITE_OTHER): Payer: BC Managed Care – PPO | Admitting: Physician Assistant

## 2019-07-01 ENCOUNTER — Encounter: Payer: Self-pay | Admitting: Physician Assistant

## 2019-07-01 ENCOUNTER — Other Ambulatory Visit: Payer: Self-pay

## 2019-07-01 VITALS — BP 150/87 | HR 67 | Temp 97.5°F | Resp 16 | Ht 61.5 in | Wt 163.8 lb

## 2019-07-01 DIAGNOSIS — E559 Vitamin D deficiency, unspecified: Secondary | ICD-10-CM

## 2019-07-01 DIAGNOSIS — R030 Elevated blood-pressure reading, without diagnosis of hypertension: Secondary | ICD-10-CM

## 2019-07-01 DIAGNOSIS — R748 Abnormal levels of other serum enzymes: Secondary | ICD-10-CM

## 2019-07-01 DIAGNOSIS — Z1211 Encounter for screening for malignant neoplasm of colon: Secondary | ICD-10-CM | POA: Diagnosis not present

## 2019-07-01 DIAGNOSIS — E119 Type 2 diabetes mellitus without complications: Secondary | ICD-10-CM

## 2019-07-01 DIAGNOSIS — E6609 Other obesity due to excess calories: Secondary | ICD-10-CM | POA: Diagnosis not present

## 2019-07-01 DIAGNOSIS — Z6830 Body mass index (BMI) 30.0-30.9, adult: Secondary | ICD-10-CM

## 2019-07-01 DIAGNOSIS — Z1239 Encounter for other screening for malignant neoplasm of breast: Secondary | ICD-10-CM | POA: Diagnosis not present

## 2019-07-01 DIAGNOSIS — Z0000 Encounter for general adult medical examination without abnormal findings: Secondary | ICD-10-CM

## 2019-07-01 DIAGNOSIS — Z803 Family history of malignant neoplasm of breast: Secondary | ICD-10-CM | POA: Insufficient documentation

## 2019-07-01 DIAGNOSIS — E7800 Pure hypercholesterolemia, unspecified: Secondary | ICD-10-CM

## 2019-07-01 LAB — POCT UA - MICROALBUMIN: Microalbumin Ur, POC: 20 mg/L

## 2019-07-01 NOTE — Patient Instructions (Addendum)
Health Maintenance After Age 67 After age 67, you are at a higher risk for certain long-term diseases and infections as well as injuries from falls. Falls are a major cause of broken bones and head injuries in people who are older than age 67. Getting regular preventive care can help to keep you healthy and well. Preventive care includes getting regular testing and making lifestyle changes as recommended by your health care provider. Talk with your health care provider about:  Which screenings and tests you should have. A screening is a test that checks for a disease when you have no symptoms.  A diet and exercise plan that is right for you. What should I know about screenings and tests to prevent falls? Screening and testing are the best ways to find a health problem early. Early diagnosis and treatment give you the best chance of managing medical conditions that are common after age 67. Certain conditions and lifestyle choices may make you more likely to have a fall. Your health care provider may recommend:  Regular vision checks. Poor vision and conditions such as cataracts can make you more likely to have a fall. If you wear glasses, make sure to get your prescription updated if your vision changes.  Medicine review. Work with your health care provider to regularly review all of the medicines you are taking, including over-the-counter medicines. Ask your health care provider about any side effects that may make you more likely to have a fall. Tell your health care provider if any medicines that you take make you feel dizzy or sleepy.  Osteoporosis screening. Osteoporosis is a condition that causes the bones to get weaker. This can make the bones weak and cause them to break more easily.  Blood pressure screening. Blood pressure changes and medicines to control blood pressure can make you feel dizzy.  Strength and balance checks. Your health care provider may recommend certain tests to check your  strength and balance while standing, walking, or changing positions.  Foot health exam. Foot pain and numbness, as well as not wearing proper footwear, can make you more likely to have a fall.  Depression screening. You may be more likely to have a fall if you have a fear of falling, feel emotionally low, or feel unable to do activities that you used to do.  Alcohol use screening. Using too much alcohol can affect your balance and may make you more likely to have a fall. What actions can I take to lower my risk of falls? General instructions  Talk with your health care provider about your risks for falling. Tell your health care provider if: ? You fall. Be sure to tell your health care provider about all falls, even ones that seem minor. ? You feel dizzy, sleepy, or off-balance.  Take over-the-counter and prescription medicines only as told by your health care provider. These include any supplements.  Eat a healthy diet and maintain a healthy weight. A healthy diet includes low-fat dairy products, low-fat (lean) meats, and fiber from whole grains, beans, and lots of fruits and vegetables. Home safety  Remove any tripping hazards, such as rugs, cords, and clutter.  Install safety equipment such as grab bars in bathrooms and safety rails on stairs.  Keep rooms and walkways well-lit. Activity   Follow a regular exercise program to stay fit. This will help you maintain your balance. Ask your health care provider what types of exercise are appropriate for you.  If you need a cane or   walker, use it as recommended by your health care provider.  Wear supportive shoes that have nonskid soles. Lifestyle  Do not drink alcohol if your health care provider tells you not to drink.  If you drink alcohol, limit how much you have: ? 0-1 drink a day for women. ? 0-2 drinks a day for men.  Be aware of how much alcohol is in your drink. In the [place] one drink equals one typical bottle of beer (12  oz), one-half glass of wine (5 oz), or one shot of hard liquor (1 oz).  Do not use any products that contain nicotine or tobacco, such as cigarettes and e-cigarettes. If you need help quitting, ask your health care provider. Summary  Having a healthy lifestyle and getting preventive care can help to protect your health and wellness after age 67.  Screening and testing are the best way to find a health problem early and help you avoid having a fall. Early diagnosis and treatment give you the best chance for managing medical conditions that are more common for people who are older than age 67.  Falls are a major cause of broken bones and head injuries in people who are older than age 67. Take precautions to prevent a fall at home.  Work with your health care provider to learn what changes you can make to improve your health and wellness and to prevent falls. This information is not intended to replace advice given to you by your health care provider. Make sure you discuss any questions you have with your health care provider. Document Revised: 07/08/2018 Document Reviewed: 01/28/2017 Elsevier Patient Education  2020 Elsevier Inc.  

## 2019-07-02 LAB — COMPREHENSIVE METABOLIC PANEL
ALT: 20 IU/L (ref 0–32)
AST: 28 IU/L (ref 0–40)
Albumin/Globulin Ratio: 1.8 (ref 1.2–2.2)
Albumin: 4.8 g/dL (ref 3.8–4.8)
Alkaline Phosphatase: 86 IU/L (ref 39–117)
BUN/Creatinine Ratio: 14 (ref 12–28)
BUN: 11 mg/dL (ref 8–27)
Bilirubin Total: 0.5 mg/dL (ref 0.0–1.2)
CO2: 25 mmol/L (ref 20–29)
Calcium: 9.9 mg/dL (ref 8.7–10.3)
Chloride: 100 mmol/L (ref 96–106)
Creatinine, Ser: 0.81 mg/dL (ref 0.57–1.00)
GFR calc Af Amer: 88 mL/min/{1.73_m2} (ref >59)
GFR calc non Af Amer: 76 mL/min/{1.73_m2} (ref >59)
Globulin, Total: 2.6 g/dL (ref 1.5–4.5)
Glucose: 129 mg/dL — ABNORMAL HIGH (ref 65–99)
Potassium: 4.6 mmol/L (ref 3.5–5.2)
Sodium: 141 mmol/L (ref 134–144)
Total Protein: 7.4 g/dL (ref 6.0–8.5)

## 2019-07-02 LAB — CBC WITH DIFFERENTIAL/PLATELET
Basophils Absolute: 0 10*3/uL (ref 0.0–0.2)
Basos: 1 %
EOS (ABSOLUTE): 0.1 10*3/uL (ref 0.0–0.4)
Eos: 1 %
Hematocrit: 43.1 % (ref 34.0–46.6)
Hemoglobin: 15.1 g/dL (ref 11.1–15.9)
Immature Grans (Abs): 0 10*3/uL (ref 0.0–0.1)
Immature Granulocytes: 0 %
Lymphocytes Absolute: 2.1 10*3/uL (ref 0.7–3.1)
Lymphs: 32 %
MCH: 31.5 pg (ref 26.6–33.0)
MCHC: 35 g/dL (ref 31.5–35.7)
MCV: 90 fL (ref 79–97)
Monocytes Absolute: 0.5 10*3/uL (ref 0.1–0.9)
Monocytes: 7 %
Neutrophils Absolute: 3.8 10*3/uL (ref 1.4–7.0)
Neutrophils: 59 %
Platelets: 246 10*3/uL (ref 150–450)
RBC: 4.79 x10E6/uL (ref 3.77–5.28)
RDW: 11.8 % (ref 11.7–15.4)
WBC: 6.5 10*3/uL (ref 3.4–10.8)

## 2019-07-02 LAB — HEMOGLOBIN A1C
Est. average glucose Bld gHb Est-mCnc: 140 mg/dL
Hgb A1c MFr Bld: 6.5 % — ABNORMAL HIGH (ref 4.8–5.6)

## 2019-07-02 LAB — TSH: TSH: 0.917 u[IU]/mL (ref 0.450–4.500)

## 2019-07-02 LAB — LIPID PANEL
Chol/HDL Ratio: 3.9 ratio (ref 0.0–4.4)
Cholesterol, Total: 225 mg/dL — ABNORMAL HIGH (ref 100–199)
HDL: 58 mg/dL (ref >39)
LDL Chol Calc (NIH): 125 mg/dL — ABNORMAL HIGH (ref 0–99)
Triglycerides: 238 mg/dL — ABNORMAL HIGH (ref 0–149)
VLDL Cholesterol Cal: 42 mg/dL — ABNORMAL HIGH (ref 5–40)

## 2019-07-04 ENCOUNTER — Telehealth: Payer: Self-pay

## 2019-07-04 NOTE — Telephone Encounter (Signed)
Called to advise patient on lab results, unable to leave VM. Will try to contact patient again later.

## 2019-07-04 NOTE — Telephone Encounter (Signed)
-----   Message from Mar Daring, [place] sent at 07/04/2019  7:27 AM EDT ----- Blood count is normal. Kidney and liver function is normal. Sodium, potassium and calcium is normal. Cholesterol slightly improved from last year. Triglycerides were up but this was a non-fasting lab. Also A1c is up from 5.6 to 6.5. Really focus and work on those healthy lifestyle modifications again with limiting fatty foods, processed meats, red meats and sugars. Thyroid is normal.

## 2019-07-06 NOTE — Telephone Encounter (Signed)
Patient advised as below. Patient verbalizes understanding and is in agreement with treatment plan.  

## 2019-07-07 ENCOUNTER — Ambulatory Visit: Payer: BC Managed Care – PPO

## 2019-07-07 ENCOUNTER — Encounter: Payer: Self-pay | Admitting: *Deleted

## 2019-07-11 ENCOUNTER — Other Ambulatory Visit: Payer: Self-pay

## 2019-07-11 ENCOUNTER — Ambulatory Visit (INDEPENDENT_AMBULATORY_CARE_PROVIDER_SITE_OTHER): Payer: Self-pay | Admitting: Gastroenterology

## 2019-07-11 DIAGNOSIS — Z8371 Family history of colonic polyps: Secondary | ICD-10-CM

## 2019-07-11 DIAGNOSIS — Z86010 Personal history of colonic polyps: Secondary | ICD-10-CM

## 2019-07-11 MED ORDER — PEG 3350-KCL-NA BICARB-NACL 420 G PO SOLR: 4000.0000 mL | Freq: Once | ORAL | 0 refills | Status: AC

## 2019-07-11 NOTE — Progress Notes (Signed)
Gastroenterology Pre-Procedure Review  Request Date: 07/19/19 Requesting Physician: Dr. Vicente Males  PATIENT REVIEW QUESTIONS: The patient responded to the following health history questions as indicated:    1. Are you having any GI issues? no 2. Do you have a personal history of Polyps? yes (2015 with Dr. Lucilla Lame 4 polyps noted) 3. Do you have a family history of Colon Cancer or Polyps? yes (sister colon polyps) 4. Diabetes Mellitus? no 5. Joint replacements in the past 12 months?no 6. Major health problems in the past 3 months?no 7. Any artificial heart valves, MVP, or defibrillator?no    MEDICATIONS & ALLERGIES:    Patient reports the following regarding taking any anticoagulation/antiplatelet therapy:   Plavix, Coumadin, Eliquis, Xarelto, Lovenox, Pradaxa, Brilinta, or Effient? no Aspirin? no  Patient confirms/reports the following medications:  Current Outpatient Medications  Medication Sig Dispense Refill  . Calcium Carbonate-Vitamin D (CALCIUM HIGH POTENCY/VITAMIN D) 600-200 MG-UNIT TABS Take by mouth.    . loratadine (CLARITIN) 10 MG tablet Take by mouth.    . MULTIPLE VITAMIN PO     . polyethylene glycol-electrolytes (GAVILYTE-N WITH FLAVOR PACK) 420 g solution Take 4,000 mLs by mouth once for 1 dose. 4000 mL 0   No current facility-administered medications for this visit.    Patient confirms/reports the following allergies:  No Known Allergies  No orders of the defined types were placed in this encounter.   AUTHORIZATION INFORMATION Primary Insurance: 1D#: Group #:  Secondary Insurance: 1D#: Group #:  SCHEDULE INFORMATION: Date: 07/15/19 Time: Location:[facility]

## 2019-07-15 ENCOUNTER — Other Ambulatory Visit
Admission: RE | Admit: 2019-07-15 | Discharge: 2019-07-15 | Disposition: A | Discharge status: Home / Self Care | Payer: BC Managed Care – PPO | Source: Ambulatory Visit | Attending: Gastroenterology | Admitting: Gastroenterology

## 2019-07-15 DIAGNOSIS — Z20822 Contact with and (suspected) exposure to covid-19: Secondary | ICD-10-CM | POA: Insufficient documentation

## 2019-07-15 DIAGNOSIS — Z01812 Encounter for preprocedural laboratory examination: Secondary | ICD-10-CM | POA: Insufficient documentation

## 2019-07-15 LAB — SARS CORONAVIRUS 2 (TAT 6-24 HRS): SARS Coronavirus 2: NEGATIVE

## 2019-07-19 ENCOUNTER — Other Ambulatory Visit: Payer: Self-pay

## 2019-07-19 ENCOUNTER — Encounter
Admission: RE | Disposition: A | Discharge status: Home / Self Care | Payer: Self-pay | Source: Home / Self Care | Attending: Gastroenterology

## 2019-07-19 ENCOUNTER — Ambulatory Visit: Payer: BC Managed Care – PPO

## 2019-07-19 ENCOUNTER — Encounter: Payer: Self-pay | Admitting: Gastroenterology

## 2019-07-19 ENCOUNTER — Ambulatory Visit
Admission: RE | Admit: 2019-07-19 | Discharge: 2019-07-19 | Disposition: A | Discharge status: Home / Self Care | Payer: BC Managed Care – PPO | Attending: Gastroenterology | Admitting: Gastroenterology

## 2019-07-19 DIAGNOSIS — D126 Benign neoplasm of colon, unspecified: Secondary | ICD-10-CM | POA: Diagnosis not present

## 2019-07-19 DIAGNOSIS — Z1211 Encounter for screening for malignant neoplasm of colon: Secondary | ICD-10-CM | POA: Insufficient documentation

## 2019-07-19 DIAGNOSIS — Z86010 Personal history of colonic polyps: Secondary | ICD-10-CM | POA: Diagnosis not present

## 2019-07-19 DIAGNOSIS — K635 Polyp of colon: Secondary | ICD-10-CM | POA: Diagnosis not present

## 2019-07-19 DIAGNOSIS — D122 Benign neoplasm of ascending colon: Secondary | ICD-10-CM | POA: Diagnosis not present

## 2019-07-19 DIAGNOSIS — Z79899 Other long term (current) drug therapy: Secondary | ICD-10-CM | POA: Insufficient documentation

## 2019-07-19 DIAGNOSIS — D125 Benign neoplasm of sigmoid colon: Secondary | ICD-10-CM | POA: Diagnosis not present

## 2019-07-19 DIAGNOSIS — Z800 Family history of malignant neoplasm of digestive organs: Secondary | ICD-10-CM | POA: Diagnosis not present

## 2019-07-19 DIAGNOSIS — D123 Benign neoplasm of transverse colon: Secondary | ICD-10-CM | POA: Diagnosis not present

## 2019-07-19 DIAGNOSIS — Z8371 Family history of colonic polyps: Secondary | ICD-10-CM

## 2019-07-19 HISTORY — PX: COLONOSCOPY WITH PROPOFOL: SHX5780

## 2019-07-19 SURGERY — COLONOSCOPY WITH PROPOFOL
Anesthesia: General

## 2019-07-19 MED ORDER — LIDOCAINE HCL (CARDIAC) PF 100 MG/5ML IV SOSY
PREFILLED_SYRINGE | INTRAVENOUS | Status: DC | PRN
  Administered 2019-07-19: 100 mg via INTRAVENOUS

## 2019-07-19 MED ORDER — PHENYLEPHRINE HCL (PRESSORS) 10 MG/ML IV SOLN
INTRAVENOUS | Status: AC
  Filled 2019-07-19: qty 1

## 2019-07-19 MED ORDER — SODIUM CHLORIDE 0.9 % IV SOLN
INTRAVENOUS | Status: DC
  Administered 2019-07-19: 1000 mL via INTRAVENOUS

## 2019-07-19 MED ORDER — PROPOFOL 10 MG/ML IV BOLUS
INTRAVENOUS | Status: DC | PRN
  Administered 2019-07-19: 70 mg via INTRAVENOUS

## 2019-07-19 MED ORDER — PROPOFOL 500 MG/50ML IV EMUL
INTRAVENOUS | Status: DC | PRN
  Administered 2019-07-19: 100 ug/kg/min via INTRAVENOUS

## 2019-07-19 MED ORDER — LIDOCAINE HCL (PF) 2 % IJ SOLN
INTRAMUSCULAR | Status: AC
  Filled 2019-07-19: qty 5

## 2019-07-19 MED ORDER — PROPOFOL 500 MG/50ML IV EMUL
INTRAVENOUS | Status: AC
  Filled 2019-07-19: qty 50

## 2019-07-19 NOTE — Op Note (Signed)
[facility] Gastroenterology Patient Name: Taylor Goodwin Procedure Date: 07/19/2019 7:59 AM MRN: [id number] Account #: [id number] Date of Birth: [birth year] Admit Type: Outpatient Age: 69 Room: [facility] ENDO ROOM 1 Gender: Female Note Status: Finalized Procedure:             Colonoscopy Indications:           High risk colon cancer surveillance: Personal history                         of colonic polyps Providers:             Jonathon Bellows MD, MD Medicines:             Propofol per Anesthesia, Monitored Anesthesia Care Complications:         No immediate complications. Procedure:             Pre-Anesthesia Assessment:                        - Prior to the procedure, a History and Physical was                         performed, and patient medications, allergies and                         sensitivities were reviewed. The patient's tolerance                         of previous anesthesia was reviewed.                        - The risks and benefits of the procedure and the                         sedation options and risks were discussed with the                         patient. All questions were answered and informed                         consent was obtained.                        - ASA Grade Assessment: II - A patient with mild                         systemic disease.                        After obtaining informed consent, the colonoscope was                         passed under direct vision. Throughout the procedure,                         the patient's blood pressure, pulse, and oxygen                         saturations were monitored continuously. The  Colonoscope was introduced through the anus and                         advanced to the the cecum, identified by the                         appendiceal orifice. The colonoscopy was performed                         with ease. The patient tolerated the procedure well.          The quality of the bowel preparation was excellent. Findings:      Three sessile polyps were found in the sigmoid colon, transverse colon       and ascending colon. The polyps were 4 to 8 mm in size. These polyps       were removed with a cold snare. Resection and retrieval were complete.      The exam was otherwise without abnormality on direct and retroflexion       views. Impression:            - Three 4 to 8 mm polyps in the sigmoid colon, in the                         transverse colon and in the ascending colon, removed                         with a cold snare. Resected and retrieved.                        - The examination was otherwise normal on direct and                         retroflexion views. Recommendation:        - Discharge patient to home (with escort).                        - Resume previous diet.                        - Continue present medications.                        - Await pathology results.                        - Repeat colonoscopy for surveillance based on                         pathology results. Procedure Code(s):     --- Professional ---                        [phone], Colonoscopy, flexible; with removal of                         tumor(s), polyp(s), or other lesion(s) by snare                         technique Diagnosis Code(s):     --- Professional ---  Z86.010, Personal history of colonic polyps                        K63.5, Polyp of colon CPT copyright 2019 American Medical Association. All rights reserved. The codes documented in this report are preliminary and upon coder review may  be revised to meet current compliance requirements. Jonathon Bellows, MD Jonathon Bellows MD, MD 07/19/2019 8:35:45 AM This report has been signed electronically. Number of Addenda: 0 Note Initiated On: 07/19/2019 7:59 AM Scope Withdrawal Time: 0 hours 15 minutes 44 seconds  Total Procedure Duration: 0 hours 20 minutes 4 seconds  Estimated  Blood Loss:  Estimated blood loss: none.      [facility]

## 2019-07-19 NOTE — Anesthesia Preprocedure Evaluation (Addendum)
Anesthesia Evaluation  Patient identified by MRN, date of birth, ID band Patient awake    Reviewed: Allergy & Precautions, NPO status , Patient's Chart, lab work & pertinent test results  History of Anesthesia Complications Negative for: history of anesthetic complications  Airway Mallampati: II       Dental   Pulmonary neg sleep apnea, neg COPD, Not current smoker,           Cardiovascular (-) hypertension(-) Past MI and (-) CHF (-) dysrhythmias (-) Valvular Problems/Murmurs     Neuro/Psych neg Seizures    GI/Hepatic Neg liver ROS, neg GERD  ,  Endo/Other  neg diabetes  Renal/GU negative Renal ROS     Musculoskeletal   Abdominal   Peds  Hematology   Anesthesia Other Findings   Reproductive/Obstetrics                            Anesthesia Physical Anesthesia Plan  ASA: I  Anesthesia Plan: General   Post-op Pain Management:    Induction: Intravenous  PONV Risk Score and Plan: 3 and Propofol infusion, TIVA and Treatment may vary due to age or medical condition  Airway Management Planned: Nasal Cannula  Additional Equipment:   Intra-op Plan:   Post-operative Plan:   Informed Consent: I have reviewed the patients History and Physical, chart, labs and discussed the procedure including the risks, benefits and alternatives for the proposed anesthesia with the patient or authorized representative who has indicated his/her understanding and acceptance.       Plan Discussed with:   Anesthesia Plan Comments:         Anesthesia Quick Evaluation

## 2019-07-19 NOTE — H&P (Signed)
Jonathon Bellows, MD [address], [place], [place], [place], [address] [address], [place], [place], [place], [address] Phone: [phone]  Fax: [phone]  Primary Care Physician:  Mar Daring, PA-C   Pre-Procedure History & Physical: HPI:  Natash Farless is a 69 y.o. female is here for an colonoscopy.   History reviewed. No pertinent past medical history.  Past Surgical History:  Procedure Laterality Date  . ABDOMINAL HYSTERECTOMY    . CESAREAN SECTION     x 2  . EYE SURGERY Bilateral 09/2014   cataract [facility]  . TUBAL LIGATION      Prior to Admission medications   Medication Sig Start Date End Date Taking? Authorizing Provider  Calcium Carbonate-Vitamin D (CALCIUM HIGH POTENCY/VITAMIN D) 600-200 MG-UNIT TABS Take by mouth.    [provider]  loratadine (CLARITIN) 10 MG tablet Take by mouth.    [provider]  MULTIPLE VITAMIN PO  04/06/05   [provider]    Allergies as of 07/11/2019  . (No Known Allergies)    Family History  Problem Relation Age of Onset  . Breast cancer Mother 61  . Hypertension Father   . Bipolar disorder Father   . Schizophrenia Father   . Hypertension Brother   . Heart disease Brother   . Hypertension Brother   . Heart disease Brother   . Diabetes Other   . Heart disease Sister   . Diabetes Sister     Social History   Socioeconomic History  . Marital status: Married    Spouse name: Not on file  . Number of children: Not on file  . Years of education: Not on file  . Highest education level: Not on file  Occupational History  . Not on file  Tobacco Use  . Smoking status: Never Smoker  . Smokeless tobacco: Never Used  Substance and Sexual Activity  . Alcohol use: No  . Drug use: No  . Sexual activity: Not on file  Other Topics Concern  . Not on file  Social History Narrative  . Not on file   Social Determinants of Health   Financial Resource Strain:   . Difficulty of Paying Living  Expenses:   Food Insecurity:   . Worried About Charity fundraiser in the Last Year:   . Arboriculturist in the Last Year:   Transportation Needs:   . Film/video editor (Medical):   Marland Kitchen Lack of Transportation (Non-Medical):   Physical Activity:   . Days of Exercise per Week:   . Minutes of Exercise per Session:   Stress:   . Feeling of Stress :   Social Connections:   . Frequency of Communication with Friends and Family:   . Frequency of Social Gatherings with Friends and Family:   . Attends Religious Services:   . Active Member of Clubs or Organizations:   . Attends Archivist Meetings:   Marland Kitchen Marital Status:   Intimate Partner Violence:   . Fear of Current or Ex-Partner:   . Emotionally Abused:   Marland Kitchen Physically Abused:   . Sexually Abused:     Review of Systems: See HPI, otherwise negative ROS  Physical Exam: BP (!) 146/82   Pulse 65   Temp (!) 96.3 F (35.7 C) (Tympanic)   Resp 16   Ht 5' 1.5" (1.562 m)   Wt 72.6 kg   SpO2 100%   BMI 29.74 kg/m  General:   Alert,  pleasant and  cooperative in NAD Head:  Normocephalic and atraumatic. Neck:  Supple; no masses or thyromegaly. Lungs:  Clear throughout to auscultation, normal respiratory effort.    Heart:  +S1, +S2, Regular rate and rhythm, No edema. Abdomen:  Soft, nontender and nondistended. Normal bowel sounds, without guarding, and without rebound.   Neurologic:  Alert and  oriented x4;  grossly normal neurologically.  Impression/Plan: Emali Wozny is here for an colonoscopy to be performed for surveillance due to prior history of colon polyps   Risks, benefits, limitations, and alternatives regarding  colonoscopy have been reviewed with the patient.  Questions have been answered.  All parties agreeable.   Jonathon Bellows, MD  07/19/2019, 8:09 AM

## 2019-07-19 NOTE — Anesthesia Postprocedure Evaluation (Signed)
Anesthesia Post Note  Patient: Taylor Goodwin  Procedure(s) Performed: COLONOSCOPY WITH PROPOFOL (N/A )  Patient location during evaluation: Endoscopy Anesthesia Type: General Level of consciousness: awake and alert Pain management: pain level controlled Vital Signs Assessment: post-procedure vital signs reviewed and stable Respiratory status: spontaneous breathing and respiratory function stable Cardiovascular status: stable Anesthetic complications: no     Last Vitals:  Vitals:   07/19/19 0839 07/19/19 0849  BP: (!) 108/59 113/68  Pulse: 65 64  Resp: 16 18  Temp: 36.4 C   SpO2: 100% 100%    Last Pain:  Vitals:   07/19/19 0849  TempSrc:   PainSc: 0-No pain                 Ashley Montminy K

## 2019-07-19 NOTE — Transfer of Care (Signed)
Immediate Anesthesia Transfer of Care Note  Patient: Taylor Goodwin  Procedure(s) Performed: COLONOSCOPY WITH PROPOFOL (N/A )  Patient Location: PACU  Anesthesia Type:MAC  Level of Consciousness: awake and drowsy  Airway & Oxygen Therapy: Patient Spontanous Breathing  Post-op Assessment: Report given to RN and Post -op Vital signs reviewed and stable  Post vital signs: Reviewed and stable  Last Vitals:  Vitals Value Taken Time  BP 108/59 07/19/19 0839  Temp    Pulse 65 07/19/19 0840  Resp 18 07/19/19 0840  SpO2 100 % 07/19/19 0840  Vitals shown include unvalidated device data.  Last Pain:  Vitals:   07/19/19 0839  TempSrc: Temporal  PainSc: 0-No pain         Complications: No apparent anesthesia complications

## 2019-07-20 ENCOUNTER — Encounter: Payer: Self-pay | Admitting: Gastroenterology

## 2019-07-20 ENCOUNTER — Encounter: Payer: Self-pay | Admitting: *Deleted

## 2019-07-20 LAB — SURGICAL PATHOLOGY

## 2019-07-25 ENCOUNTER — Other Ambulatory Visit: Payer: Self-pay

## 2019-07-25 ENCOUNTER — Ambulatory Visit (INDEPENDENT_AMBULATORY_CARE_PROVIDER_SITE_OTHER): Payer: BC Managed Care – PPO | Admitting: Physician Assistant

## 2019-07-25 DIAGNOSIS — Z23 Encounter for immunization: Secondary | ICD-10-CM | POA: Diagnosis not present

## 2020-01-03 DIAGNOSIS — H18602 Keratoconus, unspecified, left eye: Secondary | ICD-10-CM | POA: Diagnosis not present

## 2020-01-03 DIAGNOSIS — H26491 Other secondary cataract, right eye: Secondary | ICD-10-CM | POA: Diagnosis not present

## 2020-01-26 DIAGNOSIS — H26491 Other secondary cataract, right eye: Secondary | ICD-10-CM | POA: Diagnosis not present

## 2020-03-01 DIAGNOSIS — Z23 Encounter for immunization: Secondary | ICD-10-CM | POA: Diagnosis not present

## 2020-04-06 DIAGNOSIS — Z23 Encounter for immunization: Secondary | ICD-10-CM | POA: Diagnosis not present

## 2020-05-04 ENCOUNTER — Other Ambulatory Visit: Payer: Self-pay | Admitting: Physician Assistant

## 2020-05-04 DIAGNOSIS — Z1231 Encounter for screening mammogram for malignant neoplasm of breast: Secondary | ICD-10-CM

## 2020-05-21 ENCOUNTER — Ambulatory Visit
Admission: RE | Admit: 2020-05-21 | Discharge: 2020-05-21 | Disposition: A | Discharge status: Home / Self Care | Payer: BC Managed Care – PPO | Source: Ambulatory Visit | Attending: Physician Assistant | Admitting: Physician Assistant

## 2020-05-21 ENCOUNTER — Other Ambulatory Visit: Payer: Self-pay

## 2020-05-21 DIAGNOSIS — Z1231 Encounter for screening mammogram for malignant neoplasm of breast: Secondary | ICD-10-CM | POA: Insufficient documentation

## 2020-07-04 ENCOUNTER — Encounter: Payer: BC Managed Care – PPO | Admitting: Physician Assistant

## 2020-07-16 ENCOUNTER — Ambulatory Visit (INDEPENDENT_AMBULATORY_CARE_PROVIDER_SITE_OTHER): Payer: BC Managed Care – PPO | Admitting: Adult Health

## 2020-07-16 ENCOUNTER — Other Ambulatory Visit: Payer: Self-pay

## 2020-07-16 ENCOUNTER — Encounter: Payer: Self-pay | Admitting: Adult Health

## 2020-07-16 VITALS — BP 146/68 | HR 63 | Temp 97.7°F | Resp 16 | Ht 61.5 in | Wt 168.4 lb

## 2020-07-16 DIAGNOSIS — Z0000 Encounter for general adult medical examination without abnormal findings: Secondary | ICD-10-CM

## 2020-07-16 DIAGNOSIS — E6609 Other obesity due to excess calories: Secondary | ICD-10-CM

## 2020-07-16 DIAGNOSIS — Z1389 Encounter for screening for other disorder: Secondary | ICD-10-CM | POA: Diagnosis not present

## 2020-07-16 DIAGNOSIS — E7800 Pure hypercholesterolemia, unspecified: Secondary | ICD-10-CM

## 2020-07-16 DIAGNOSIS — Z6830 Body mass index (BMI) 30.0-30.9, adult: Secondary | ICD-10-CM

## 2020-07-16 LAB — POCT URINALYSIS DIPSTICK
Bilirubin, UA: NEGATIVE
Blood, UA: NEGATIVE
Glucose, UA: NEGATIVE
Ketones, UA: NEGATIVE
Leukocytes, UA: NEGATIVE
Nitrite, UA: NEGATIVE
Protein, UA: NEGATIVE
Spec Grav, UA: <=1.005 — AB (ref 1.010–1.025)
Urobilinogen, UA: 0.2 E.U./dL
pH, UA: 6 (ref 5.0–8.0)

## 2020-07-16 NOTE — Progress Notes (Signed)
Complete physical exam   Patient: Taylor Goodwin   DOB: 27-Nov-1950   70 y.o. Female  MRN: [id number] Visit Date: 07/16/2020  Today's healthcare provider: Marcille Buffy, FNP   Chief Complaint  Patient presents with  . Annual Exam   Subjective     HPI  Taylor Goodwin is a 70 y.o. female who presents today for a complete physical exam.  She reports consuming a general diet. The patient does not participate in regular exercise at present. She generally feels fairly well. She reports sleeping poorly. She does not have additional problems to discuss today.   Colonoscopy last year 06/2019 and repeat in 3 years.  Mammogram up to date. IMPRESSION: No mammographic evidence of malignancy. A result letter of this screening mammogram will be mailed directly to the patient.  RECOMMENDATION: Screening mammogram in one year. (Code:[id number])  BI-RADS CATEGORY  1: Negative.  Abdominal Hystererectomy - has ovaries.   Patient  denies any fever, body aches,chills, rash, chest pain, shortness of breath, nausea, vomiting, or diarrhea.  Denies dizziness, lightheadedness, pre syncopal or syncopal episodes.   No past medical history on file. Past Surgical History:  Procedure Laterality Date  . ABDOMINAL HYSTERECTOMY    . CESAREAN SECTION     x 2  . COLONOSCOPY WITH PROPOFOL N/A 07/19/2019   Procedure: COLONOSCOPY WITH PROPOFOL;  Surgeon: Jonathon Bellows, MD;  Location: [facility] ENDOSCOPY;  Service: Gastroenterology;  Laterality: N/A;  . EYE SURGERY Bilateral 09/2014   cataract [facility]  . TUBAL LIGATION     Social History   Socioeconomic History  . Marital status: Married    Spouse name: Not on file  . Number of children: Not on file  . Years of education: Not on file  . Highest education level: Not on file  Occupational History  . Not on file  Tobacco Use  . Smoking status: Never Smoker  . Smokeless tobacco: Never Used  Vaping Use  . Vaping Use: Never used  Substance and  Sexual Activity  . Alcohol use: No  . Drug use: No  . Sexual activity: Not on file  Other Topics Concern  . Not on file  Social History Narrative  . Not on file   Social Determinants of Health   Financial Resource Strain: Not on file  Food Insecurity: Not on file  Transportation Needs: Not on file  Physical Activity: Not on file  Stress: Not on file  Social Connections: Not on file  Intimate Partner Violence: Not on file   Family Status  Relation Name Status  . Mother  Deceased  . Father  Deceased  . Brother  Alive  . Brother  Alive  . Other  (Not Specified)  . Sister  Alive   Family History  Problem Relation Age of Onset  . Breast cancer Mother 1  . Hypertension Father   . Bipolar disorder Father   . Schizophrenia Father   . Hypertension Brother   . Heart disease Brother   . Hypertension Brother   . Heart disease Brother   . Diabetes Other   . Heart disease Sister   . Diabetes Sister    No Known Allergies  Patient Care Team: Rubye Beach as PCP - General (Family Medicine)   Medications: Outpatient Medications Prior to Visit  Medication Sig  . Calcium Carbonate-Vitamin D (CALCIUM HIGH POTENCY/VITAMIN D) 600-200 MG-UNIT TABS Take by mouth.  . loratadine (CLARITIN) 10 MG tablet Take by mouth.  . MULTIPLE VITAMIN  PO    No facility-administered medications prior to visit.    Review of Systems  Constitutional: Negative.   HENT: Positive for congestion and sneezing.   Eyes: Positive for itching.  Respiratory: Negative.   Cardiovascular: Negative.   Genitourinary: Negative.   Neurological: Negative.   Psychiatric/Behavioral: Negative.       Objective    BP (!) 146/68   Pulse 63   Temp 97.7 F (36.5 C) (Oral)   Resp 16   Ht 5' 1.5" (1.562 m)   Wt 168 lb 6.4 oz (76.4 kg)   SpO2 100%   BMI 31.30 kg/m     Physical Exam Vitals reviewed.  Constitutional:      General: She is not in acute distress.    Appearance: She is  well-developed. She is not diaphoretic.     Interventions: She is not intubated. HENT:     Head: Normocephalic and atraumatic.     Right Ear: Tympanic membrane, ear canal and external ear normal. There is no impacted cerumen.     Left Ear: Tympanic membrane, ear canal and external ear normal. There is no impacted cerumen.     Nose: Nose normal. No congestion or rhinorrhea.     Mouth/Throat:     Mouth: Mucous membranes are moist.     Pharynx: No oropharyngeal exudate or posterior oropharyngeal erythema.  Eyes:     General: Lids are normal. No scleral icterus.       Right eye: No discharge.        Left eye: No discharge.     Conjunctiva/sclera: Conjunctivae normal.     Right eye: Right conjunctiva is not injected. No exudate or hemorrhage.    Left eye: Left conjunctiva is not injected. No exudate or hemorrhage.    Pupils: Pupils are equal, round, and reactive to light.  Neck:     Thyroid: No thyroid mass or thyromegaly.     Vascular: Normal carotid pulses. No carotid bruit, hepatojugular reflux or JVD.     Trachea: Trachea and phonation normal. No tracheal tenderness or tracheal deviation.     Meningeal: Brudzinski's sign and Kernig's sign absent.  Cardiovascular:     Rate and Rhythm: Normal rate and regular rhythm.     Pulses: Normal pulses.          Radial pulses are 2+ on the right side and 2+ on the left side.       Dorsalis pedis pulses are 2+ on the right side and 2+ on the left side.       Posterior tibial pulses are 2+ on the right side and 2+ on the left side.     Heart sounds: Normal heart sounds, S1 normal and S2 normal. Heart sounds not distant. No murmur heard. No friction rub. No gallop.   Pulmonary:     Effort: Pulmonary effort is normal. No tachypnea, bradypnea, accessory muscle usage or respiratory distress. She is not intubated.     Breath sounds: Normal breath sounds. No stridor. No wheezing, rhonchi or rales.  Chest:     Chest wall: No tenderness.  Breasts:      Right: No supraclavicular adenopathy.     Left: No supraclavicular adenopathy.    Abdominal:     General: Bowel sounds are normal. There is no distension or abdominal bruit.     Palpations: Abdomen is soft. There is no shifting dullness, fluid wave, hepatomegaly, splenomegaly, mass or pulsatile mass.     Tenderness: There is no abdominal  tenderness. There is no right CVA tenderness, left CVA tenderness, guarding or rebound.     Hernia: No hernia is present.  Musculoskeletal:        General: No tenderness or deformity. Normal range of motion.     Cervical back: Full passive range of motion without pain, normal range of motion and neck supple. No edema, erythema or rigidity. No spinous process tenderness or muscular tenderness. Normal range of motion.  Lymphadenopathy:     Head:     Right side of head: No submental, submandibular, tonsillar, preauricular, posterior auricular or occipital adenopathy.     Left side of head: No submental, submandibular, tonsillar, preauricular, posterior auricular or occipital adenopathy.     Cervical: No cervical adenopathy.     Right cervical: No superficial, deep or posterior cervical adenopathy.    Left cervical: No superficial, deep or posterior cervical adenopathy.     Upper Body:     Right upper body: No supraclavicular or pectoral adenopathy.     Left upper body: No supraclavicular or pectoral adenopathy.  Skin:    General: Skin is warm and dry.     Coloration: Skin is not pale.     Findings: No abrasion, bruising, burn, ecchymosis, erythema, lesion, petechiae or rash.     Nails: There is no clubbing.  Neurological:     Mental Status: She is alert and oriented to person, place, and time.     GCS: GCS eye subscore is 4. GCS verbal subscore is 5. GCS motor subscore is 6.     Cranial Nerves: No cranial nerve deficit.     Sensory: No sensory deficit.     Motor: No tremor, atrophy, abnormal muscle tone or seizure activity.     Coordination:  Coordination normal.     Gait: Gait normal.     Deep Tendon Reflexes: Reflexes are normal and symmetric. Reflexes normal. Babinski sign absent on the right side. Babinski sign absent on the left side.     Reflex Scores:      Tricep reflexes are 2+ on the right side and 2+ on the left side.      Bicep reflexes are 2+ on the right side and 2+ on the left side.      Brachioradialis reflexes are 2+ on the right side and 2+ on the left side.      Patellar reflexes are 2+ on the right side and 2+ on the left side.      Achilles reflexes are 2+ on the right side and 2+ on the left side. Psychiatric:        Speech: Speech normal.        Behavior: Behavior normal.        Thought Content: Thought content normal.        Judgment: Judgment normal.       Last depression screening scores PHQ 2/9 Scores 07/16/2020 07/01/2019 04/05/2018  PHQ - 2 Score 0 0 0  PHQ- 9 Score 2 - 0   Last fall risk screening Fall Risk  07/16/2020  Falls in the past year? 0  Number falls in past yr: 0  Injury with Fall? 0  Follow up -   Last Audit-C alcohol use screening Alcohol Use Disorder Test (AUDIT) 07/16/2020  1. How often do you have a drink containing alcohol? 0  2. How many drinks containing alcohol do you have on a typical day when you are drinking? 0  3. How often do you have six or  more drinks on one occasion? 0  AUDIT-C Score 0  Alcohol Brief Interventions/Follow-up -   A score of 3 or more in women, and 4 or more in men indicates increased risk for alcohol abuse, EXCEPT if all of the points are from question 1   Results for orders placed or performed in visit on 07/16/20  POCT urinalysis dipstick  Result Value Ref Range   Color, UA yellow    Clarity, UA clear    Glucose, UA Negative Negative   Bilirubin, UA negative    Ketones, UA negative    Spec Grav, UA <=1.005 (A) 1.010 - 1.025   Blood, UA negative    pH, UA 6.0 5.0 - 8.0   Protein, UA Negative Negative   Urobilinogen, UA 0.2 0.2 or 1.0  E.U./dL   Nitrite, UA negative    Leukocytes, UA Negative Negative   Appearance     Odor      Assessment & Plan    Routine Health Maintenance and Physical Exam  Exercise Activities and Dietary recommendations Goals    . Exercise 150 minutes per week (moderate activity)       Immunization History  Administered Date(s) Administered  . Influenza Inj Mdck Quad Pf 02/18/2018  . Influenza Split 03/06/2007  . Influenza,inj,Quad PF,6+ Mos 12/31/2018  . Influenza-Unspecified 12/29/2013, 01/02/2017  . Moderna Sars-Covid-2 Vaccination 06/10/2019  . Pneumococcal Conjugate-13 07/25/2019  . Pneumococcal Polysaccharide-23 04/05/2018  . Td 07/25/2019  . Tdap 05/01/2009  . Zoster Recombinat (Shingrix) 12/31/2018, 03/30/2019    Health Maintenance  Topic Date Due  . HEMOGLOBIN A1C  12/31/2019  . URINE MICROALBUMIN  06/30/2020  . COVID-19 Vaccine (2 - Moderna 3-dose series) 08/01/2020 (Originally 07/08/2019)  . INFLUENZA VACCINE  10/29/2020  . OPHTHALMOLOGY EXAM  12/29/2020  . MAMMOGRAM  05/21/2021  . FOOT EXAM  07/16/2021  . COLONOSCOPY (Pts 45-40yrs Insurance coverage will need to be confirmed)  07/19/2022  . TETANUS/TDAP  07/24/2029  . DEXA SCAN  Completed  . Hepatitis C Screening  Completed  . PNA vac Low Risk Adult  Completed  . HPV VACCINES  Aged Out    Discussed health benefits of physical activity, and encouraged her to engage in regular exercise appropriate for her age and condition. Routine adult health maintenance  Screening for blood or protein in urine - Plan: POCT urinalysis dipstick  Hypercholesteremia - Plan: Lipid panel, HgB A1c  Class 1 obesity due to excess calories with serious comorbidity and body mass index (BMI) of 30.0 to 30.9 in adult - Plan: CBC with Differential/Platelet, Comprehensive metabolic panel, Lipid panel, TSH, HgB A1c  No orders of the defined types were placed in this encounter.  Orders Placed This Encounter  Procedures  . CBC with  Differential/Platelet  . Comprehensive metabolic panel  . Lipid panel  . TSH  . HgB A1c  . POCT urinalysis dipstick     Return in 3 months (on 10/15/2020), or if symptoms worsen or fail to improve, for at any time for any worsening symptoms, Go to Emergency room/ urgent care if worse.     The entirety of the information documented in the History of Present Illness, Review of Systems and Physical Exam were personally obtained by me. Portions of this information were initially documented by the CMA and reviewed by me for thoroughness and accuracy.      Marcille Buffy, [place] [phone] (phone) [phone] (fax)  [place]

## 2020-07-16 NOTE — Patient Instructions (Signed)
Health Maintenance, Female Adopting a healthy lifestyle and getting preventive care are important in promoting health and wellness. Ask your health care provider about:  The right schedule for you to have regular tests and exams.  Things you can do on your own to prevent diseases and keep yourself healthy. What should I know about diet, weight, and exercise? Eat a healthy diet  Eat a diet that includes plenty of vegetables, fruits, low-fat dairy products, and lean protein.  Do not eat a lot of foods that are high in solid fats, added sugars, or sodium.   Maintain a healthy weight Body mass index (BMI) is used to identify weight problems. It estimates body fat based on height and weight. Your health care provider can help determine your BMI and help you achieve or maintain a healthy weight. Get regular exercise Get regular exercise. This is one of the most important things you can do for your health. Most adults should:  Exercise for at least 150 minutes each week. The exercise should increase your heart rate and make you sweat (moderate-intensity exercise).  Do strengthening exercises at least twice a week. This is in addition to the moderate-intensity exercise.  Spend less time sitting. Even light physical activity can be beneficial. Watch cholesterol and blood lipids Have your blood tested for lipids and cholesterol at 68 years of age, then have this test every 5 years. Have your cholesterol levels checked more often if:  Your lipid or cholesterol levels are high.  You are older than 68 years of age.  You are at high risk for heart disease. What should I know about cancer screening? Depending on your health history and family history, you may need to have cancer screening at various ages. This may include screening for:  Breast cancer.  Cervical cancer.  Colorectal cancer.  Skin cancer.  Lung cancer. What should I know about heart disease, diabetes, and high blood  pressure? Blood pressure and heart disease  High blood pressure causes heart disease and increases the risk of stroke. This is more likely to develop in people who have high blood pressure readings, are of African descent, or are overweight.  Have your blood pressure checked: ? Every 3-5 years if you are 18-39 years of age. ? Every year if you are 40 years old or older. Diabetes Have regular diabetes screenings. This checks your fasting blood sugar level. Have the screening done:  Once every three years after age 40 if you are at a normal weight and have a low risk for diabetes.  More often and at a younger age if you are overweight or have a high risk for diabetes. What should I know about preventing infection? Hepatitis B If you have a higher risk for hepatitis B, you should be screened for this virus. Talk with your health care provider to find out if you are at risk for hepatitis B infection. Hepatitis C Testing is recommended for:  Everyone born from 1945 through 1965.  Anyone with known risk factors for hepatitis C. Sexually transmitted infections (STIs)  Get screened for STIs, including gonorrhea and chlamydia, if: ? You are sexually active and are younger than 68 years of age. ? You are older than 68 years of age and your health care provider tells you that you are at risk for this type of infection. ? Your sexual activity has changed since you were last screened, and you are at increased risk for chlamydia or gonorrhea. Ask your health care provider   if you are at risk.  Ask your health care provider about whether you are at high risk for HIV. Your health care provider may recommend a prescription medicine to help prevent HIV infection. If you choose to take medicine to prevent HIV, you should first get tested for HIV. You should then be tested every 3 months for as long as you are taking the medicine. Pregnancy  If you are about to stop having your period (premenopausal) and  you may become pregnant, seek counseling before you get pregnant.  Take 400 to 800 micrograms (mcg) of folic acid every day if you become pregnant.  Ask for birth control (contraception) if you want to prevent pregnancy. Osteoporosis and menopause Osteoporosis is a disease in which the bones lose minerals and strength with aging. This can result in bone fractures. If you are 65 years old or older, or if you are at risk for osteoporosis and fractures, ask your health care provider if you should:  Be screened for bone loss.  Take a calcium or vitamin D supplement to lower your risk of fractures.  Be given hormone replacement therapy (HRT) to treat symptoms of menopause. Follow these instructions at home: Lifestyle  Do not use any products that contain nicotine or tobacco, such as cigarettes, e-cigarettes, and chewing tobacco. If you need help quitting, ask your health care provider.  Do not use street drugs.  Do not share needles.  Ask your health care provider for help if you need support or information about quitting drugs. Alcohol use  Do not drink alcohol if: ? Your health care provider tells you not to drink. ? You are pregnant, may be pregnant, or are planning to become pregnant.  If you drink alcohol: ? Limit how much you use to 0-1 drink a day. ? Limit intake if you are breastfeeding.  Be aware of how much alcohol is in your drink. In the [place] one drink equals one 12 oz bottle of beer (355 mL), one 5 oz glass of wine (148 mL), or one 1 oz glass of hard liquor (44 mL). General instructions  Schedule regular health, dental, and eye exams.  Stay current with your vaccines.  Tell your health care provider if: ? You often feel depressed. ? You have ever been abused or do not feel safe at home. Summary  Adopting a healthy lifestyle and getting preventive care are important in promoting health and wellness.  Follow your health care provider's instructions about healthy  diet, exercising, and getting tested or screened for diseases.  Follow your health care provider's instructions on monitoring your cholesterol and blood pressure. This information is not intended to replace advice given to you by your health care provider. Make sure you discuss any questions you have with your health care provider. Document Revised: 03/10/2018 Document Reviewed: 03/10/2018 Elsevier Patient Education  2021 [employer]vier Inc[employer]  

## 2021-04-24 ENCOUNTER — Telehealth: Payer: Self-pay | Admitting: Physician Assistant

## 2021-04-24 ENCOUNTER — Other Ambulatory Visit: Payer: Self-pay | Admitting: Physician Assistant

## 2021-04-24 DIAGNOSIS — Z1231 Encounter for screening mammogram for malignant neoplasm of breast: Secondary | ICD-10-CM

## 2021-04-24 NOTE — Telephone Encounter (Signed)
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 She has never seen Dr. Caryn Section. Today, [facility] scheduled appointment for CPE with Dr. Caryn Section on  Aug 09 2021. I called and advised patient that Dr. Caryn Section is not taking on Jenni or [facility] patients. I advised patient that we need to reschedule her CPE appointment with one of the new providers in the office. I offered to switch the CPE appointment over to another provider for the same date and time. Patient declined and requested that I cancel the appointment. She says she has been a patient here for over 10 years and she feels like she is being shuffled around. Patient stated "I may have to find a new provider somewhere else".   [facility] called requesting mammogram orders. Please review and advise.

## 2021-04-24 NOTE — Telephone Encounter (Signed)
[facility] is requesting annual mamo orders from Dr. Caryn Section since Helaine Chess.  NP is no longer patient provider

## 2021-05-31 ENCOUNTER — Other Ambulatory Visit: Payer: Self-pay

## 2021-05-31 ENCOUNTER — Ambulatory Visit
Admission: RE | Admit: 2021-05-31 | Discharge: 2021-05-31 | Disposition: A | Discharge status: Home / Self Care | Payer: BC Managed Care – PPO | Source: Ambulatory Visit | Attending: Physician Assistant | Admitting: Physician Assistant

## 2021-05-31 DIAGNOSIS — Z1231 Encounter for screening mammogram for malignant neoplasm of breast: Secondary | ICD-10-CM | POA: Insufficient documentation

## 2021-08-09 ENCOUNTER — Encounter: Payer: BC Managed Care – PPO | Admitting: Family Medicine

## 2021-09-17 DIAGNOSIS — Z13220 Encounter for screening for lipoid disorders: Secondary | ICD-10-CM | POA: Diagnosis not present

## 2021-09-17 DIAGNOSIS — M810 Age-related osteoporosis without current pathological fracture: Secondary | ICD-10-CM | POA: Diagnosis not present

## 2021-09-17 DIAGNOSIS — Z7689 Persons encountering health services in other specified circumstances: Secondary | ICD-10-CM | POA: Diagnosis not present

## 2021-09-17 DIAGNOSIS — Z131 Encounter for screening for diabetes mellitus: Secondary | ICD-10-CM | POA: Diagnosis not present

## 2021-09-17 DIAGNOSIS — Z780 Asymptomatic menopausal state: Secondary | ICD-10-CM | POA: Diagnosis not present

## 2021-09-17 DIAGNOSIS — Z0000 Encounter for general adult medical examination without abnormal findings: Secondary | ICD-10-CM | POA: Diagnosis not present

## 2021-11-26 DIAGNOSIS — K1121 Acute sialoadenitis: Secondary | ICD-10-CM | POA: Diagnosis not present

## 2022-05-13 ENCOUNTER — Other Ambulatory Visit: Payer: Self-pay | Admitting: Internal Medicine

## 2022-05-13 DIAGNOSIS — Z1231 Encounter for screening mammogram for malignant neoplasm of breast: Secondary | ICD-10-CM

## 2022-06-03 ENCOUNTER — Ambulatory Visit
Admission: RE | Admit: 2022-06-03 | Discharge: 2022-06-03 | Disposition: A | Discharge status: Home / Self Care | Payer: BC Managed Care – PPO | Source: Ambulatory Visit | Attending: Internal Medicine | Admitting: Internal Medicine

## 2022-06-03 DIAGNOSIS — Z1231 Encounter for screening mammogram for malignant neoplasm of breast: Secondary | ICD-10-CM | POA: Insufficient documentation

## 2023-04-23 ENCOUNTER — Encounter: Payer: Self-pay | Admitting: Internal Medicine

## 2023-04-23 ENCOUNTER — Other Ambulatory Visit: Payer: Self-pay | Admitting: Internal Medicine

## 2023-04-23 DIAGNOSIS — Z1231 Encounter for screening mammogram for malignant neoplasm of breast: Secondary | ICD-10-CM

## 2023-06-04 ENCOUNTER — Ambulatory Visit
Admission: RE | Admit: 2023-06-04 | Discharge: 2023-06-04 | Disposition: A | Discharge status: Home / Self Care | Payer: BC Managed Care – PPO | Source: Ambulatory Visit | Attending: Internal Medicine | Admitting: Internal Medicine

## 2023-06-04 DIAGNOSIS — Z1231 Encounter for screening mammogram for malignant neoplasm of breast: Secondary | ICD-10-CM | POA: Insufficient documentation

## 2023-06-25 ENCOUNTER — Emergency Department

## 2023-06-25 ENCOUNTER — Other Ambulatory Visit: Payer: Self-pay

## 2023-06-25 ENCOUNTER — Emergency Department
Admission: EM | Admit: 2023-06-25 | Discharge: 2023-06-25 | Disposition: A | Discharge status: Home / Self Care | Attending: Emergency Medicine | Admitting: Emergency Medicine

## 2023-06-25 ENCOUNTER — Encounter: Payer: Self-pay | Admitting: Emergency Medicine

## 2023-06-25 DIAGNOSIS — R718 Other abnormality of red blood cells: Secondary | ICD-10-CM | POA: Insufficient documentation

## 2023-06-25 DIAGNOSIS — E041 Nontoxic single thyroid nodule: Secondary | ICD-10-CM

## 2023-06-25 DIAGNOSIS — R0602 Shortness of breath: Secondary | ICD-10-CM | POA: Diagnosis not present

## 2023-06-25 DIAGNOSIS — R202 Paresthesia of skin: Secondary | ICD-10-CM | POA: Insufficient documentation

## 2023-06-25 DIAGNOSIS — R079 Chest pain, unspecified: Secondary | ICD-10-CM

## 2023-06-25 LAB — BASIC METABOLIC PANEL WITH GFR
Anion gap: 13 (ref 5–15)
BUN: 19 mg/dL (ref 8–23)
CO2: 27 mmol/L (ref 22–32)
Calcium: 10.6 mg/dL — ABNORMAL HIGH (ref 8.9–10.3)
Chloride: 101 mmol/L (ref 98–111)
Creatinine, Ser: 0.72 mg/dL (ref 0.44–1.00)
GFR, Estimated: >60 mL/min (ref >60)
Glucose, Bld: 106 mg/dL — ABNORMAL HIGH (ref 70–99)
Potassium: 4.7 mmol/L (ref 3.5–5.1)
Sodium: 141 mmol/L (ref 135–145)

## 2023-06-25 LAB — URINALYSIS, W/ REFLEX TO CULTURE (INFECTION SUSPECTED)
Bacteria, UA: NONE SEEN
Bilirubin Urine: NEGATIVE
Glucose, UA: NEGATIVE mg/dL
Hgb urine dipstick: NEGATIVE
Ketones, ur: NEGATIVE mg/dL
Leukocytes,Ua: NEGATIVE
Nitrite: NEGATIVE
Protein, ur: NEGATIVE mg/dL
Specific Gravity, Urine: 1.042 — ABNORMAL HIGH (ref 1.005–1.030)
pH: 7 (ref 5.0–8.0)

## 2023-06-25 LAB — CBC
HCT: 48.3 % — ABNORMAL HIGH (ref 36.0–46.0)
Hemoglobin: 16.7 g/dL — ABNORMAL HIGH (ref 12.0–15.0)
MCH: 31.7 pg (ref 26.0–34.0)
MCHC: 34.6 g/dL (ref 30.0–36.0)
MCV: 91.8 fL (ref 80.0–100.0)
Platelets: 220 10*3/uL (ref 150–400)
RBC: 5.26 MIL/uL — ABNORMAL HIGH (ref 3.87–5.11)
RDW: 11.3 % — ABNORMAL LOW (ref 11.5–15.5)
WBC: 7.3 10*3/uL (ref 4.0–10.5)
nRBC: 0 % (ref 0.0–0.2)

## 2023-06-25 LAB — TROPONIN I (HIGH SENSITIVITY)
Troponin I (High Sensitivity): 4 ng/L (ref <18)
Troponin I (High Sensitivity): 4 ng/L

## 2023-06-25 LAB — BRAIN NATRIURETIC PEPTIDE: B Natriuretic Peptide: 6.9 pg/mL (ref 0.0–100.0)

## 2023-06-25 MED ORDER — SODIUM CHLORIDE 0.9 % IV BOLUS: 500.0000 mL | Freq: Once | INTRAVENOUS | Status: DC

## 2023-06-25 MED ORDER — IOHEXOL 350 MG/ML SOLN
100.0000 mL | Freq: Once | INTRAVENOUS | Status: AC | PRN
  Administered 2023-06-25: 100 mL via INTRAVENOUS

## 2023-06-25 NOTE — ED Provider Notes (Signed)
 Care of this patient assumed from prior physician at 1500 pending imaging, repeat troponin, and disposition. Please see prior physician note for further details.  Briefly, this is a 73 year old female presenting with chest pain and shortness of breath.  Labs overall reassuring.  Signed out to me imaging and repeat troponin.  Repeat troponin resulted within normal limits.  CT head without acute findings.  CT dissection protocol without evidence of dissection or other acute finding.  Did note abnormal bladder appearance with recommendation for urinalysis which was reassuring.  Did also note incidental thyroid nodule for which patient was updated and will follow-up with her primary care doctor.Marland Kitchen  Ultrasound of the lower extremity negative.  Patient reassessed.  Updated on results of workup.  She is comfortable with discharge home and outpatient follow-up.  Will place referral for cardiology in the setting of her chest pain.  Strict return precautions provided.  Patient discharged in stable condition.   Trinna Post, MD 06/25/23 [phone]

## 2023-06-25 NOTE — ED Provider Notes (Signed)
 [facility] Provider Note    Event Date/Time   First MD Initiated Contact with Patient 06/25/23 1359     (approximate)   History   Chest Pain   HPI  Taylor Goodwin is a 73 y.o. female who comes in with chest pain radiating to the left arm and shortness of breath.  Patient reports having some shortness of breath on Friday.  Patient report feeling like her breath is been shorter and that she is having to catch her breath.  She states that then she developed some left arm tingling that started yesterday.  She reports that the location of the tingling comes and goes but currently it is from her elbow down.  She does report some left leg swelling but that is been going on for some time now.  She never have not had it looked into.  Patient reports only be on vitamins and calcium for osteopenia.  She states that she is here because her sister had similar symptoms and recently had a cardiac bypass surgery so she went to make sure that her heart was okay.  Physical Exam   Triage Vital Signs: ED Triage Vitals  Encounter Vitals Group     BP 06/25/23 1257 (!) 180/88     Systolic BP Percentile --      Diastolic BP Percentile --      Pulse Rate 06/25/23 1257 72     Resp 06/25/23 1257 19     Temp 06/25/23 1257 97.7 F (36.5 C)     Temp Source 06/25/23 1257 Oral     SpO2 06/25/23 1257 98 %     Weight 06/25/23 1254 145 lb (65.8 kg)     Height 06/25/23 1254 5' 0.5" (1.537 m)     Head Circumference --      Peak Flow --      Pain Score 06/25/23 1254 1     Pain Loc --      Pain Education --      Exclude from Growth Chart --     Most recent vital signs: Vitals:   06/25/23 1257  BP: (!) 180/88  Pulse: 72  Resp: 19  Temp: 97.7 F (36.5 C)  SpO2: 98%     General: Awake, no distress.  CV:  Good peripheral perfusion.  Resp:  Normal effort.  Abd:  No distention.  Other:  She reports associated changes on the left arm up to the elbow.  Equal pulses bilaterally.   She does have a little bit of edema noted in the left leg   ED Results / Procedures / Treatments   Labs (all labs ordered are listed, but only abnormal results are displayed) Labs Reviewed  BASIC METABOLIC PANEL WITH GFR - Abnormal; Notable for the following components:      Result Value   Glucose, Bld 106 (*)    Calcium 10.6 (*)    All other components within normal limits  CBC - Abnormal; Notable for the following components:   RBC 5.26 (*)    Hemoglobin 16.7 (*)    HCT 48.3 (*)    RDW 11.3 (*)    All other components within normal limits  TROPONIN I (HIGH SENSITIVITY)     EKG  My interpretation of EKG:  Normal sinus rate of 67 without any ST elevation or T wave inversions, incomplete right bundle branch block  RADIOLOGY I have reviewed the xray personally and interpreted no evidence of wide mediastinum   PROCEDURES:  Critical Care performed: No  Procedures   MEDICATIONS ORDERED IN ED: Medications - No data to display   IMPRESSION / MDM / ASSESSMENT AND PLAN / ED COURSE  I reviewed the triage vital signs and the nursing notes.   Patient's presentation is most consistent with acute presentation with potential threat to life or bodily function.  Patient comes in with chest pain, shortness of breath.  Chest pain has resolved but still having some arm tingling.  Will get CT dissection given patient is hypertensive and this would be more life-threatening I discussed with radiology and they will try to evaluate for PE as best as they can with it.  Patient declines anything for pain at this time.  Will add on ultrasound to evaluate for DVT. Ct head ordered given the left arm tingling to rule out stroke LLK was yesterday out of window stroke code.   Troponin was negative.  BMP shows slightly elevated calcium.  CBC shows elevated hemoglobin  Discussed with patient to stop her calcium as her calcium levels are elevated today.    Patient be handed off to oncoming team  pending CT imaging and further workup, management  The patient is on the cardiac monitor to evaluate for evidence of arrhythmia and/or significant heart rate changes.      FINAL CLINICAL IMPRESSION(S) / ED DIAGNOSES   Final diagnoses:  Chest pain, unspecified type     Rx / DC Orders   ED Discharge Orders     None        Note:  This document was prepared using Dragon voice recognition software and may include unintentional dictation errors.   Concha Se, MD 06/25/23 [phone]

## 2023-06-25 NOTE — Discharge Instructions (Addendum)
 Stop talking calcium until follow up with primary care doctor before restarting.  Please discuss the thyroid nodule that was seen on your imaging with your doctor.  Included the report below.  I have placed a referral to cardiology for your chest pain.  They will reach out to you to arrange follow-up.  Return to ER for worsening symptoms or any other concerns.  " 3. 1.2 cm hypodense nodule in the left thyroid gland with foci of  peripheral calcification. Consider further evaluation with  nonemergent thyroid ultrasound.  "

## 2023-06-25 NOTE — ED Notes (Signed)
 See triage notes. Patient c/o shortness of breath, pain/numbness in the left arm and chest pain. Patient is anxious and feels nauseated. Mild dizziness but not diaphoretic. Family hx of heart issues.

## 2023-06-25 NOTE — ED Triage Notes (Addendum)
 Pt c/o left chest pain with radiation to left arm and SOB. First noticed SOB on Friday, arm numbness yesterday, and CP this morning. Pt is also anxious and says she feels nauseated. Pt reports mild dizziness but no sweating. No prior cardiac issues. BP in triage 180/88, no prior hx HTN.

## 2023-06-30 ENCOUNTER — Encounter: Payer: Self-pay | Admitting: Cardiology

## 2023-06-30 ENCOUNTER — Ambulatory Visit: Attending: Cardiology | Admitting: Cardiology

## 2023-06-30 VITALS — BP 132/70 | HR 65 | Resp 16 | Ht 60.5 in | Wt 139.8 lb

## 2023-06-30 DIAGNOSIS — E7800 Pure hypercholesterolemia, unspecified: Secondary | ICD-10-CM | POA: Diagnosis not present

## 2023-06-30 DIAGNOSIS — R072 Precordial pain: Secondary | ICD-10-CM

## 2023-06-30 DIAGNOSIS — I2510 Atherosclerotic heart disease of native coronary artery without angina pectoris: Secondary | ICD-10-CM

## 2023-06-30 MED ORDER — ROSUVASTATIN CALCIUM 10 MG PO TABS: 10.0000 mg | ORAL_TABLET | Freq: Every day | ORAL | 3 refills | Status: AC

## 2023-06-30 MED ORDER — METOPROLOL TARTRATE 50 MG PO TABS: ORAL_TABLET | ORAL | 0 refills | Status: AC

## 2023-06-30 NOTE — Patient Instructions (Addendum)
 Medication Instructions:  Start Crestor 10 mg daily Continue all other medications  Lab Work: Fasting lipid and hepatic panels in 3 months  Testing/Procedures: Coronary CT  will be scheduled at [facility] after approved by insurance    Follow instructions below  Follow-Up: At [facility], you and your health needs are our priority.  As part of our continuing mission to provide you with exceptional heart care, our providers are all part of one team.  This team includes your primary Cardiologist (physician) and Advanced Practice Providers or APPs (Physician Assistants and Nurse Practitioners) who all work together to provide you with the care you need, when you need it.  Your next appointment:  To Be Determined    Provider:  Dr.Jordan   We recommend signing up for the patient portal called "MyChart".  Sign up information is provided on this After Visit Summary.  MyChart is used to connect with patients for Virtual Visits (Telemedicine).  Patients are able to view lab/test results, encounter notes, upcoming appointments, etc.  Non-urgent messages can be sent to your provider as well.   To learn more about what you can do with MyChart, go to [email or web address].     Your cardiac CT will be scheduled at one of the below locations:   [facility] [address] [place], [place] [address] [phone]  OR  [facility] [address] [place], [place] [address] [phone]  OR   [facility] [address] [place], [place] [address] [phone]  OR   [facility] [address] [place], [place] [address] [phone]  If scheduled at [facility], please arrive at the [facility] and Children's Entrance (Entrance C2) of [facility] 30 minutes prior to test start time. You can use the FREE valet parking offered at entrance C (encouraged to control the  heart rate for the test)  Proceed to the [facility] Radiology Department (first floor) to check-in and test prep.  All radiology patients and guests should use entrance C2 at [facility], accessed from [facility], even though the hospital's physical address listed is [address].    If scheduled at [facility] or [facility], please arrive 15 mins early for check-in and test prep.  There is spacious parking and easy access to the radiology department from the [facility] entrance. Please enter here and check-in with the desk attendant.   If scheduled at [facility], please arrive 30 minutes early for check-in and test prep.  Please follow these instructions carefully (unless otherwise directed):  An IV will be required for this test and Nitroglycerin will be given.    On the Night Before the Test: Be sure to Drink plenty of water. Do not consume any caffeinated/decaffeinated beverages or chocolate 12 hours prior to your test. Do not take any antihistamines 12 hours prior to your test.   On the Day of the Test: Drink plenty of water until 1 hour prior to the test. Do not eat any food 1 hour prior to test. You may take your regular medications prior to the test.  Take metoprolol 50 mg two hours prior to test. If you take Furosemide/Hydrochlorothiazide/Spironolactone/Chlorthalidone, please HOLD on the morning of the test. FEMALES- please wear underwire-free bra if available, avoid dresses & tight clothing       After the Test: Drink plenty of  water. After receiving IV contrast, you may experience a mild flushed feeling. This is normal. On occasion, you may experience a mild rash up to 24 hours after the test. This is not dangerous. If this occurs, you can take Benadryl 25 mg, Zyrtec, Claritin, or Allegra and increase your fluid intake. (Patients taking Tikosyn should avoid Benadryl,  and may take Zyrtec, Claritin, or Allegra) If you experience trouble breathing, this can be serious. If it is severe call 911 IMMEDIATELY. If it is mild, please call our office.  We will call to schedule your test 2-4 weeks out understanding that some insurance companies will need an authorization prior to the service being performed.   For more information and frequently asked questions, please visit our website : [email or web address]  For non-scheduling related questions, please contact the cardiac imaging nurse navigator should you have any questions/concerns: Cardiac Imaging Nurse Navigators Direct Office Dial: [phone]   For scheduling needs, including cancellations and rescheduling, please call [place], [phone].           1st Floor: - Lobby - Registration  - Pharmacy  - Lab - Cafe  2nd Floor: - PV Lab - Diagnostic Testing (echo, CT, nuclear med)  3rd Floor: - Vacant  4th Floor: - TCTS (cardiothoracic surgery) - AFib Clinic - Structural Heart Clinic - Vascular Surgery  - Vascular Ultrasound  5th Floor: - HeartCare Cardiology (general and EP) - Clinical Pharmacy for coumadin, hypertension, lipid, weight-loss medications, and med management appointments    Valet parking services will be available as well.

## 2023-06-30 NOTE — Progress Notes (Signed)
 Cardiology Office Note:    Date:  06/30/2023   ID:  Taylor Goodwin, DOB 08-25-1950, MRN [id number]  PCP:  Enid Baas, MD   [facility] Providers Cardiologist:  None     Referring MD: Concha Se, MD   Chief Complaint  Patient presents with   Chest Pain    History of Present Illness:    Taylor Goodwin is a 73 y.o. female seen at the request of Dr Fuller Plan for evaluation of chest pain. Seen recently in the ED. Troponins and BNP were normal. Ecg nonacute.  CT was negative for dissection but did show some coronary and aortic calcification. She did have ETT and Echo in 2019 at [facility]. Echo showed mild TR and MR otherwise normal.   She states for the past 10 days she has had some intermittent SOB and mild left upper chest pain. Also notes pain and numbness in her left arm with burning. No clear aggravating factors. Brother history of aortic dissection  History reviewed. No pertinent past medical history.  Past Surgical History:  Procedure Laterality Date   ABDOMINAL HYSTERECTOMY     CESAREAN SECTION     x 2   COLONOSCOPY WITH PROPOFOL N/A 07/19/2019   Procedure: COLONOSCOPY WITH PROPOFOL;  Surgeon: Wyline Mood, MD;  Location: [facility] ENDOSCOPY;  Service: Gastroenterology;  Laterality: N/A;   EYE SURGERY Bilateral 09/2014   cataract [facility]   TUBAL LIGATION      Current Medications: Current Meds  Medication Sig   Calcium Carbonate-Vitamin D (CALCIUM HIGH POTENCY/VITAMIN D) 600-200 MG-UNIT TABS Take by mouth.   loratadine (CLARITIN) 10 MG tablet Take by mouth.   MULTIPLE VITAMIN PO    rosuvastatin (CRESTOR) 10 MG tablet Take 1 tablet (10 mg total) by mouth daily.     Allergies:   Patient has no known allergies.   Social History   Socioeconomic History   Marital status: Widowed    Spouse name: Not on file   Number of children: 2   Years of education: Not on file   Highest education level: Not on file  Occupational History   Not on file  Tobacco  Use   Smoking status: Never   Smokeless tobacco: Never  Vaping Use   Vaping status: Never Used  Substance and Sexual Activity   Alcohol use: No   Drug use: No   Sexual activity: Not on file  Other Topics Concern   Not on file  Social History Narrative   Print production planner   Social Drivers of Health   Financial Resource Strain: Low Risk  (09/24/2022)   Received [facility]   Overall Financial Resource Strain (CARDIA)    Difficulty of Paying Living Expenses: Not hard at all  Food Insecurity: No Food Insecurity (09/24/2022)   Received from [facility]   Hunger Vital Sign    Worried About Running Out of Food in the Last Year: Never true    Ran Out of Food in the Last Year: Never true  Transportation Needs: No Transportation Needs (09/24/2022)   Received from [facility] - Transportation    In the past 12 months, has lack of transportation kept you from medical appointments or from getting medications?: No    Lack of Transportation (Non-Medical): No  Physical Activity: Not on file  Stress: Not on file  Social Connections: Not on file     Family History: The patient's family history includes Bipolar disorder in  her father; Breast cancer (age of onset: 51) in her mother; Diabetes in her sister and another family member; Heart disease in her brother, brother, and sister; Hypertension in her brother, brother, and father; Schizophrenia in her father.  ROS:   Please see the history of present illness.     All other systems reviewed and are negative.  EKGs/Labs/Other Studies Reviewed:    The following studies were reviewed today: Ecg dated 06/25/23 showed NSR with incomplete RBBB. Normal. I have personally reviewed and interpreted this study.       Recent Labs: 06/25/2023: B Natriuretic Peptide 6.9; BUN 19; Creatinine, Ser 0.72; Hemoglobin 16.7; Platelets 220; Potassium 4.7; Sodium 141  Recent Lipid Panel     Component Value Date/Time   CHOL 225 (H) 07/01/2019 0826   TRIG 238 (H) 07/01/2019 0826   HDL 58 07/01/2019 0826   CHOLHDL 3.9 07/01/2019 0826   CHOLHDL 3.2 03/03/2017 1014   LDLCALC 125 (H) 07/01/2019 0826   LDLCALC 120 (H) 03/03/2017 1014   Dated 09/24/22: A1c 5.7% Cholesterol 204, triglycerides 89, HDL 71, LDL 115.  Risk Assessment/Calculations:                Physical Exam:    VS:  BP 132/70 (BP Location: Left Arm, Patient Position: Sitting, Cuff Size: Normal)   Pulse 65   Resp 16   Ht 5' 0.5" (1.537 m)   Wt 139 lb 12.8 oz (63.4 kg)   SpO2 97%   BMI 26.85 kg/m     Wt Readings from Last 3 Encounters:  06/30/23 139 lb 12.8 oz (63.4 kg)  06/25/23 145 lb (65.8 kg)  07/16/20 168 lb 6.4 oz (76.4 kg)     GEN:  Well nourished, well developed in no acute distress HEENT: Normal NECK: No JVD; No carotid bruits LYMPHATICS: No lymphadenopathy CARDIAC: RRR, no murmurs, rubs, gallops RESPIRATORY:  Clear to auscultation without rales, wheezing or rhonchi  ABDOMEN: Soft, non-tender, non-distended MUSCULOSKELETAL:  No edema; No deformity  SKIN: Warm and dry NEUROLOGIC:  Alert and oriented x 3 PSYCHIATRIC:  Normal affect   ASSESSMENT:    1. Chest pain, precordial   2. Coronary artery calcification   3. Hypercholesterolemia    PLAN:    In order of problems listed above:  Chest pain/left arm pain. Recommend coronary CTA to evaluate ischemic risk given coronary calcification. If negative consider whether symptoms related to cervical radiculopathy.  HLD. Goal LDL < 70 based on aortic and coronary calcification. Will start crestor 10 mg daily. Recommend repeat labs in 2-3 months. Discussed lifestyle modification with healthy diet and regular aerobic exercise.            Medication Adjustments/Labs and Tests Ordered: Current medicines are reviewed at length with the patient today.  Concerns regarding medicines are outlined above.  No orders of the defined types were  placed in this encounter.  Meds ordered this encounter  Medications   rosuvastatin (CRESTOR) 10 MG tablet    Sig: Take 1 tablet (10 mg total) by mouth daily.    Dispense:  90 tablet    Refill:  3    There are no Patient Instructions on file for this visit.   Signed, Gabreil Yonkers [place], MD  06/30/2023 8:47 AM    [facility]

## 2023-07-01 ENCOUNTER — Telehealth (HOSPITAL_COMMUNITY): Payer: Self-pay | Admitting: *Deleted

## 2023-07-01 NOTE — Telephone Encounter (Signed)

## 2023-07-02 ENCOUNTER — Ambulatory Visit
Admission: RE | Admit: 2023-07-02 | Discharge: 2023-07-02 | Disposition: A | Discharge status: Home / Self Care | Source: Ambulatory Visit | Attending: Cardiology | Admitting: Cardiology

## 2023-07-02 DIAGNOSIS — I2510 Atherosclerotic heart disease of native coronary artery without angina pectoris: Secondary | ICD-10-CM | POA: Insufficient documentation

## 2023-07-02 DIAGNOSIS — R072 Precordial pain: Secondary | ICD-10-CM | POA: Insufficient documentation

## 2023-07-02 DIAGNOSIS — E7800 Pure hypercholesterolemia, unspecified: Secondary | ICD-10-CM | POA: Diagnosis present

## 2023-07-02 MED ORDER — NITROGLYCERIN 0.4 MG SL SUBL
0.4000 mg | SUBLINGUAL_TABLET | Freq: Once | SUBLINGUAL | Status: AC
  Administered 2023-07-02: 0.4 mg via SUBLINGUAL

## 2023-07-02 MED ORDER — SODIUM CHLORIDE 0.9 % IV SOLN: INTRAVENOUS | Status: DC

## 2023-07-02 MED ORDER — IOHEXOL 350 MG/ML SOLN
75.0000 mL | Freq: Once | INTRAVENOUS | Status: AC | PRN
  Administered 2023-07-02: 75 mL via INTRAVENOUS

## 2023-07-02 NOTE — Progress Notes (Signed)
 Patient tolerated CT well. Drank water after. Vital signs stable encourage to drink water throughout day.Reasons explained and verbalized understanding. Ambulated steady gait.

## 2023-07-09 ENCOUNTER — Ambulatory Visit: Admission: RE | Admit: 2023-07-09 | Source: Ambulatory Visit

## 2023-10-29 ENCOUNTER — Encounter: Payer: Self-pay | Admitting: Ophthalmology

## 2023-10-30 ENCOUNTER — Encounter: Payer: Self-pay | Admitting: Ophthalmology

## 2023-10-30 NOTE — Anesthesia Preprocedure Evaluation (Addendum)
 Anesthesia Evaluation  Patient identified by MRN, date of birth, ID band Patient awake    Reviewed: Allergy & Precautions, H&P , NPO status , Patient's Chart, lab work & pertinent test results  Airway Mallampati: II  TM Distance: >3 FB Neck ROM: Full    Dental no notable dental hx. (+) Caps   Pulmonary neg pulmonary ROS   Pulmonary exam normal breath sounds clear to auscultation       Cardiovascular negative cardio ROS Normal cardiovascular exam Rhythm:Regular Rate:Normal     Neuro/Psych negative neurological ROS  negative psych ROS   GI/Hepatic negative GI ROS, Neg liver ROS,,,  Endo/Other  negative endocrine ROSdiabetes    Renal/GU negative Renal ROS  negative genitourinary   Musculoskeletal negative musculoskeletal ROS (+)    Abdominal   Peds negative pediatric ROS (+)  Hematology negative hematology ROS (+)   Anesthesia Other Findings Abnormal liver enzymes  Blood pressure elevated without history of HTN Type 2 diabetes mellitus without complication, unspecified whether long term insulin use      Reproductive/Obstetrics negative OB ROS                              Anesthesia Physical Anesthesia Plan  ASA: 2  Anesthesia Plan: MAC   Post-op Pain Management:    Induction: Intravenous  PONV Risk Score and Plan:   Airway Management Planned: Natural Airway and Nasal Cannula  Additional Equipment:   Intra-op Plan:   Post-operative Plan:   Informed Consent: I have reviewed the patients History and Physical, chart, labs and discussed the procedure including the risks, benefits and alternatives for the proposed anesthesia with the patient or authorized representative who has indicated his/her understanding and acceptance.     Dental Advisory Given  Plan Discussed with: Anesthesiologist, CRNA and Surgeon  Anesthesia Plan Comments: (Patient consented for risks of  anesthesia including but not limited to:  - adverse reactions to medications - damage to eyes, teeth, lips or other oral mucosa - nerve damage due to positioning  - sore throat or hoarseness - Damage to heart, brain, nerves, lungs, other parts of body or loss of life  Patient voiced understanding and assent.)         Anesthesia Quick Evaluation

## 2023-11-11 NOTE — Discharge Instructions (Signed)
 INSTRUCTIONS FOLLOWING OCULOPLASTIC SURGERY AMY EMERSON GAY, MD  AFTER YOUR EYE SURGERY, THER ARE MANY THINGS WHICH YOU, THE PATIENT, CAN DO TO ASSURE THE BEST POSSIBLE RESULT FROM YOUR OPERATION.  THIS SHEET SHOULD BE REFERRED TO WHENEVER QUESTIONS ARISE.  IF THERE ARE ANY QUESTIONS NOT ANSWERED HERE, DO NOT HESITATE TO CALL OUR OFFICE AT [phone] OR [phone].  THERE IS ALWAYS SOMEONE AVAILABLE TO CALL IF QUESTIONS OR PROBLEMS ARISE.  VISION: Your vision may be blurred and out of focus after surgery until you are able to stop using your ointment, swelling resolves and your eye(s) heal. This may take 1 to 2 weeks at the least.  If your vision becomes gradually more dim or dark, this is not normal and you need to call our office immediately.  EYE CARE: For the first 48 hours after surgery, use ice packs frequently - "20 minutes on, 20 minutes off" - to help reduce swelling and bruising.  Small bags of frozen peas or corn make good ice packs along with cloths soaked in ice water.  If you are wearing a patch or other type of dressing following surgery, keep this on for the amount of time specified by your doctor.  For the first week following surgery, you will need to treat your stitches with great care.  It is OK to shower, but take care to not allow soapy water to run into your eye(s) to help reduce chances of infection.  You may gently clean the eyelashes and around the eye(s) with cotton balls and bottled water, BUT DO NOT RUB THE STITCHES VIGOROUSLY.  Keeping your stitches moist with ointment will help promote healing with minimal scar formation.  ACTIVITY: When you leave the surgery center, you should go home, rest and be inactive.  The eye(s) may feel scratchy and keeping the eyes closed will allow for faster healing.  The first week following surgery, avoid straining (anything making the face turn red) or lifting over 20 pounds.  Additionally, avoid bending which causes your head to go below  your waist.  Using your eyes will NOT harm them, so feel free to read, watch television, use the computer, etc as desired.  Driving depends on each individual, so check with your doctor if you have questions about driving. Do not wear contact lenses for about 2 weeks.  Do not wear eye makeup for 2 weeks.  Avoid swimming, hot tubs, gardening, and dusting for 1 to 2 weeks to reduce the risk of an infection.  MEDICATIONS:  You will be given a prescription for an ointment to use 4 times a day on your stitches.  You can use the ointment in your eyes if they feel scratchy or irritated.  If you eyelid(s) don't close completely when you sleep, put some ointment in your eyes before bedtime.  EMERGENCY: If you experience SEVERE EYE PAIN OR HEADACHE UNRELIEVED BY TYLENOL OR TRAMADOL , NAUSEA OR VOMITING, WORSENING REDNESS, OR WORSENING VISION (ESPECIALLY VISION THAT WAS INITIALLY BETTER) CALL [phone] OR [phone] DURING BUSINESS HOURS OR AFTER HOURS.

## 2023-11-13 ENCOUNTER — Encounter
Admission: RE | Disposition: A | Discharge status: Home / Self Care | Payer: Self-pay | Source: Home / Self Care | Attending: Ophthalmology

## 2023-11-13 ENCOUNTER — Encounter: Payer: Self-pay | Admitting: Ophthalmology

## 2023-11-13 ENCOUNTER — Ambulatory Visit
Admission: RE | Admit: 2023-11-13 | Discharge: 2023-11-13 | Disposition: A | Discharge status: Home / Self Care | Attending: Ophthalmology | Admitting: Ophthalmology

## 2023-11-13 ENCOUNTER — Other Ambulatory Visit: Payer: Self-pay

## 2023-11-13 ENCOUNTER — Ambulatory Visit: Payer: Self-pay | Admitting: Anesthesiology

## 2023-11-13 DIAGNOSIS — E119 Type 2 diabetes mellitus without complications: Secondary | ICD-10-CM | POA: Insufficient documentation

## 2023-11-13 DIAGNOSIS — Z79899 Other long term (current) drug therapy: Secondary | ICD-10-CM | POA: Insufficient documentation

## 2023-11-13 DIAGNOSIS — Z833 Family history of diabetes mellitus: Secondary | ICD-10-CM | POA: Insufficient documentation

## 2023-11-13 DIAGNOSIS — E669 Obesity, unspecified: Secondary | ICD-10-CM | POA: Diagnosis not present

## 2023-11-13 DIAGNOSIS — H02834 Dermatochalasis of left upper eyelid: Secondary | ICD-10-CM | POA: Insufficient documentation

## 2023-11-13 DIAGNOSIS — Z6828 Body mass index (BMI) 28.0-28.9, adult: Secondary | ICD-10-CM | POA: Insufficient documentation

## 2023-11-13 DIAGNOSIS — H02831 Dermatochalasis of right upper eyelid: Secondary | ICD-10-CM | POA: Diagnosis present

## 2023-11-13 DIAGNOSIS — H02403 Unspecified ptosis of bilateral eyelids: Secondary | ICD-10-CM | POA: Insufficient documentation

## 2023-11-13 HISTORY — DX: Abnormal levels of other serum enzymes: R74.8

## 2023-11-13 HISTORY — PX: BROW LIFT: SHX178

## 2023-11-13 HISTORY — DX: Obesity, unspecified: E66.9

## 2023-11-13 HISTORY — DX: Type 2 diabetes mellitus without complications: E11.9

## 2023-11-13 HISTORY — DX: Elevated blood-pressure reading, without diagnosis of hypertension: R03.0

## 2023-11-13 SURGERY — BLEPHAROPLASTY
Anesthesia: Monitor Anesthesia Care | Laterality: Bilateral

## 2023-11-13 MED ORDER — LIDOCAINE HCL (PF) 2 % IJ SOLN
INTRAMUSCULAR | Status: AC
  Filled 2023-11-13: qty 5

## 2023-11-13 MED ORDER — MIDAZOLAM HCL 2 MG/2ML IJ SOLN
INTRAMUSCULAR | Status: DC | PRN
  Administered 2023-11-13 (×2): 1 mg via INTRAVENOUS

## 2023-11-13 MED ORDER — PROPOFOL 10 MG/ML IV BOLUS
INTRAVENOUS | Status: AC
  Filled 2023-11-13: qty 40

## 2023-11-13 MED ORDER — MIDAZOLAM HCL 2 MG/2ML IJ SOLN
INTRAMUSCULAR | Status: AC
  Filled 2023-11-13: qty 2

## 2023-11-13 MED ORDER — BSS IO SOLN
INTRAOCULAR | Status: DC | PRN
  Administered 2023-11-13: 15 mL via INTRAOCULAR

## 2023-11-13 MED ORDER — TRAMADOL HCL 50 MG PO TABS: ORAL_TABLET | ORAL | 0 refills | Status: AC

## 2023-11-13 MED ORDER — ERYTHROMYCIN 5 MG/GM OP OINT
TOPICAL_OINTMENT | OPHTHALMIC | Status: DC | PRN
Start: 2023-11-13 — End: 2023-11-13
  Administered 2023-11-13: 1 via OPHTHALMIC

## 2023-11-13 MED ORDER — ERYTHROMYCIN 5 MG/GM OP OINT: TOPICAL_OINTMENT | OPHTHALMIC | 2 refills | Status: AC

## 2023-11-13 MED ORDER — FENTANYL CITRATE (PF) 100 MCG/2ML IJ SOLN
INTRAMUSCULAR | Status: DC | PRN
  Administered 2023-11-13 (×4): 25 ug via INTRAVENOUS

## 2023-11-13 MED ORDER — PROPOFOL 500 MG/50ML IV EMUL
INTRAVENOUS | Status: DC | PRN
  Administered 2023-11-13: 75 ug/kg/min via INTRAVENOUS

## 2023-11-13 MED ORDER — TETRACAINE HCL 0.5 % OP SOLN
OPHTHALMIC | Status: DC | PRN
  Administered 2023-11-13: 2 [drp] via OPHTHALMIC

## 2023-11-13 MED ORDER — ONDANSETRON HCL 4 MG/2ML IJ SOLN
INTRAMUSCULAR | Status: AC
  Filled 2023-11-13: qty 2

## 2023-11-13 MED ORDER — ONDANSETRON HCL 4 MG/2ML IJ SOLN
INTRAMUSCULAR | Status: DC | PRN
Start: 2023-11-13 — End: 2023-11-13
  Administered 2023-11-13: 4 mg via INTRAVENOUS

## 2023-11-13 MED ORDER — LIDOCAINE HCL (CARDIAC) PF 100 MG/5ML IV SOSY
PREFILLED_SYRINGE | INTRAVENOUS | Status: DC | PRN
  Administered 2023-11-13: 50 mg via INTRAVENOUS

## 2023-11-13 MED ORDER — SODIUM CHLORIDE 0.9 % IV SOLN: INTRAVENOUS | Status: DC

## 2023-11-13 MED ORDER — LIDOCAINE-EPINEPHRINE 2 %-1:100000 IJ SOLN
INTRAMUSCULAR | Status: DC | PRN
  Administered 2023-11-13: 4.5 mL via OPHTHALMIC
  Administered 2023-11-13: 1 mL via OPHTHALMIC

## 2023-11-13 MED ORDER — LACTATED RINGERS IV SOLN: INTRAVENOUS | Status: DC

## 2023-11-13 MED ORDER — FENTANYL CITRATE (PF) 100 MCG/2ML IJ SOLN
INTRAMUSCULAR | Status: AC
  Filled 2023-11-13: qty 2

## 2023-11-13 SURGICAL SUPPLY — 18 items
APPLICATOR COTTON TIP 3IN (MISCELLANEOUS) ×1 IMPLANT
BLADE SURG 15 STRL LF DISP TIS (BLADE) ×1 IMPLANT
CORD BIP STRL DISP 12FT (MISCELLANEOUS) ×1 IMPLANT
GAUZE SPONGE 2X2 STRL 8-PLY (GAUZE/BANDAGES/DRESSINGS) ×10 IMPLANT
GAUZE SPONGE 4X4 12PLY STRL (GAUZE/BANDAGES/DRESSINGS) ×1 IMPLANT
GLOVE SURG UNDER POLY LF SZ7 (GLOVE) ×2 IMPLANT
GOWN STRL REUS W/ TWL LRG LVL3 (GOWN DISPOSABLE) ×1 IMPLANT
MARKER SKIN XFINE TIP W/RULER (MISCELLANEOUS) ×1 IMPLANT
NDL FILTER BLUNT 18X1 1/2 (NEEDLE) ×1 IMPLANT
NDL HYPO 30X.5 LL (NEEDLE) ×2 IMPLANT
NEEDLE FILTER BLUNT 18X1 1/2 (NEEDLE) ×1 IMPLANT
NEEDLE HYPO 30X.5 LL (NEEDLE) ×2 IMPLANT
PACK ENT CUSTOM (PACKS) ×1 IMPLANT
SOLUTION PREP PVP 2OZ (MISCELLANEOUS) ×1 IMPLANT
SUT GUT PLAIN 6-0 1X18 ABS (SUTURE) ×1 IMPLANT
SYR 10ML LL (SYRINGE) ×1 IMPLANT
SYR 3ML LL SCALE MARK (SYRINGE) ×1 IMPLANT
WATER STERILE IRR 250ML POUR (IV SOLUTION) ×1 IMPLANT

## 2023-11-13 NOTE — Interval H&P Note (Signed)
 History and Physical Interval Note:  11/13/2023 7:27 AM  Taylor Goodwin  has presented today for surgery, with the diagnosis of Bilateral Dermatochalasis.  The various methods of treatment have been discussed with the patient and family. After consideration of risks, benefits and other options for treatment, the patient has consented to  Procedure(s): BLEPHAROPLASTY (Bilateral) and anterior orbitotomy for fat removalas a surgical intervention.  The patient's history has been reviewed, patient examined, no change in status, stable for surgery.  I have reviewed the patient's chart and labs.  Questions were answered to the patient's satisfaction.     Ashley, Althia Egolf M

## 2023-11-13 NOTE — H&P (Signed)
 [facility]: [facility]  Primary Care Physician:  Sherial Bail, MD Ophthalmologist: Dr. Greig Goodwin. Taylor, M.D.  Pre-Procedure History & Physical: HPI:  Taylor Goodwin is a 73 y.o. female here for periocular surgery.   Past Medical History:  Diagnosis Date   Abnormal liver enzymes    Blood pressure elevated without history of HTN    Obesity    Type 2 diabetes mellitus without complication, unspecified whether long term insulin use (HCC)     Past Surgical History:  Procedure Laterality Date   ABDOMINAL HYSTERECTOMY     CESAREAN SECTION     x 2   COLONOSCOPY WITH PROPOFOL  N/A 07/19/2019   Procedure: COLONOSCOPY WITH PROPOFOL ;  Surgeon: Therisa Bi, MD;  Location: [facility] ENDOSCOPY;  Service: Gastroenterology;  Laterality: N/A;   EYE SURGERY Bilateral 09/2014   cataract [facility]   TUBAL LIGATION      Prior to Admission medications   Medication Sig Start Date End Date Taking? Authorizing Provider  Calcium  Carbonate-Vitamin D  (CALCIUM  HIGH POTENCY/VITAMIN D ) 600-200 MG-UNIT TABS Take by mouth.   Yes [provider]  loratadine (CLARITIN) 10 MG tablet Take by mouth.   Yes [provider]  MULTIPLE VITAMIN PO  04/06/05  Yes [provider]  metoprolol  tartrate (LOPRESSOR ) 50 MG tablet Take 50 mg 2 hours before Coronary CT Patient not taking: Reported on 10/29/2023 06/30/23   [place], Peter M, MD  rosuvastatin  (CRESTOR ) 10 MG tablet Take 1 tablet (10 mg total) by mouth daily. Patient not taking: Reported on 10/29/2023 06/30/23 09/28/23  [place], Peter M, MD    Allergies as of 09/28/2023   (No Known Allergies)    Family History  Problem Relation Age of Onset   Breast cancer Mother 42   Hypertension Father    Bipolar disorder Father    Schizophrenia Father    Heart disease Sister        CABG   Diabetes Sister    Hypertension Brother    Heart disease Brother    Hypertension Brother    Heart disease Brother        aortic dissection   Diabetes  Other     Social History   Socioeconomic History   Marital status: Widowed    Spouse name: Not on file   Number of children: 2   Years of education: Not on file   Highest education level: Not on file  Occupational History   Not on file  Tobacco Use   Smoking status: Never   Smokeless tobacco: Never  Vaping Use   Vaping status: Never Used  Substance and Sexual Activity   Alcohol use: No   Drug use: No   Sexual activity: Not on file  Other Topics Concern   Not on file  Social History Narrative   Print production planner   Social Drivers of Health   Financial Resource Strain: Low Risk  (09/24/2023)   Received from [facility]   Overall Financial Resource Strain (CARDIA)    Difficulty of Paying Living Expenses: Not hard at all  Food Insecurity: No Food Insecurity (09/24/2023)   Received from [facility]   Hunger Vital Sign    Within the past 12 months, you worried that your food would run out before you got the money to buy more.: Never true    Within the past 12 months, the food you bought just didn't last and you didn't have money to get more.: Never true  Transportation Needs:  No Transportation Needs (09/24/2023)   Received from [facility] - Transportation    In the past 12 months, has lack of transportation kept you from medical appointments or from getting medications?: No    Lack of Transportation (Non-Medical): No  Physical Activity: Not on file  Stress: Not on file  Social Connections: Not on file  Intimate Partner Violence: Not on file    Review of Systems: See HPI, otherwise negative ROS  Physical Exam: BP (!) 140/74   Pulse 66   Temp (!) 97.4 F (36.3 C) (Temporal)   Resp 16   Ht 5' (1.524 m)   Wt 65.8 kg   SpO2 96%   BMI 28.32 kg/m  General:   Alert and cooperative in NAD Head:  Normocephalic and atraumatic. Respiratory:  Normal work of breathing.  Impression/Plan: Taylor Goodwin is here  for periocular surgery.  Risks, benefits, limitations, and alternatives regarding surgery have been reviewed with the patient.  Questions have been answered.  All parties agreeable.   Taylor Greig HERO, MD  11/13/2023, 7:27 AM

## 2023-11-13 NOTE — Op Note (Signed)
 Preoperative Diagnosis:  1.  Visually significant dermatochalasis bilateral  Upper Eyelid(s) 2.  Heavy orbital fat prolapse both upper eyelids  Postoperative Diagnosis:  Same.  Procedure(s) Performed:   1.  Upper eyelid blepharoplasty with excess skin excision  bilateral  Upper Eyelid(s) 2.  Anterior orbitotomy without bone for removal of prolapsed orbital fat both upper eyelids  Surgeon: Greig HERO. Ashley, M.D.  Assistants: none  Anesthesia: MAC  Specimens: None.  Estimated Blood Loss: Minimal.  Complications: None.  Operative Findings: None Dictated  Procedure:   Allergies were reviewed and the patient is allergic to Patient has no known allergies..   After the risks, benefits, complications and alternatives were discussed with the patient, appropriate informed consent was obtained and the patient was brought to the operating suite. The patient was reclined supine and a timeout was conducted.  The patient was then sedated.  Local anesthetic consisting of a 50-50 mixture of 2% lidocaine with epinephrine and 0.75% bupivacaine with added Hylenex was injected subcutaneously to bilateral  upper eyelid(s). After adequate local was instilled, the patient was prepped and draped in the usual sterile fashion for eyelid surgery.   Attention was turned to the upper eyelids. A 8mm upper eyelid crease incision line was marked with calipers on both  upper eyelid(s).  A pinch test was used to estimate the amount of excess skin to remove and this was marked in standard blepharoplasty style fashion. Attention was turned to the  right  upper eyelid. A #15 blade was used to open the premarked incision line. A Skin and muscle flap was excised and hemostasis was obtained with bipolar cautery. A buttonhole was created medially and centrally in the orbital septum to reveal the prolapsed medial and central fat pockets. This was dissected free from fascial attachments, cauterized towards the pedicle base and  excised to produce flattening of the upper eyelid.  A strip of ROOF was excised to help debulk the lateral upper eyelid.   Attention was then turned to the opposite eyelid where the same procedure was performed in the same manner. Hemostasis was obtained with bipolar cautery throughout. All incisions were then closed with a combination of running and interrupted 6-0 fast absorbing plain suture. The patient tolerated the procedure well.  Erythromycin ophthalmic ointment was applied to her incision sites, followed by ice packs. She was taken to the recovery area where she recovered without difficulty.  Post-Op Plan/Instructions:  The patient was instructed to use ice packs frequently for the next 48 hours. She was instructed to use Erythromycin ophthalmic ointment on her incisions 4 times a day for the next 12 to 14 days. She was given a prescription for tramadol (or similar) for pain control should Tylenol not be effective. She was asked to to follow up in 2-3 weeks' time at the [facility] in [place], [place] or sooner as needed for problems.  Trease Bremner M. Ashley, M.D. Ophthalmology

## 2023-11-13 NOTE — Anesthesia Postprocedure Evaluation (Signed)
 Anesthesia Post Note  Patient: Taylor Goodwin  Procedure(s) Performed: BLEPHAROPLASTY (Bilateral)  Patient location during evaluation: PACU Anesthesia Type: MAC Level of consciousness: awake and alert Pain management: pain level controlled Vital Signs Assessment: post-procedure vital signs reviewed and stable Respiratory status: spontaneous breathing, nonlabored ventilation, respiratory function stable and patient connected to nasal cannula oxygen Cardiovascular status: stable and blood pressure returned to baseline Postop Assessment: no apparent nausea or vomiting Anesthetic complications: no   No notable events documented.   Last Vitals:  Vitals:   11/13/23 0845 11/13/23 0852  BP: (!) 156/77 (!) 161/62  Pulse: (!) 56 (!) 53  Resp: 12 12  Temp:    SpO2: 96% 97%    Last Pain:  Vitals:   11/13/23 0845  TempSrc:   PainSc: 0-No pain                 Eathan Groman C Drishti Pepperman

## 2023-11-13 NOTE — Transfer of Care (Signed)
 Immediate Anesthesia Transfer of Care Note  Patient: Taylor Goodwin  Procedure(s) Performed: BLEPHAROPLASTY (Bilateral)  Patient Location: PACU  Anesthesia Type: MAC  Level of Consciousness: awake, alert  and patient cooperative  Airway and Oxygen Therapy: Patient Spontanous Breathing and Patient connected to supplemental oxygen  Post-op Assessment: Post-op Vital signs reviewed, Patient's Cardiovascular Status Stable, Respiratory Function Stable, Patent Airway and No signs of Nausea or vomiting  Post-op Vital Signs: Reviewed and stable  Complications: No notable events documented.

## 2024-05-04 ENCOUNTER — Other Ambulatory Visit: Payer: Self-pay | Admitting: Internal Medicine

## 2024-05-04 DIAGNOSIS — Z1231 Encounter for screening mammogram for malignant neoplasm of breast: Secondary | ICD-10-CM

## 2024-06-07 ENCOUNTER — Encounter
